# Patient Record
Sex: Female | Born: 1935 | Race: White | Hispanic: No | Smoking: Never smoker
Health system: Southern US, Community
[De-identification: ages and names within clinical notes are randomized; demographics above are authoritative.]

## PROBLEM LIST (undated history)

## (undated) DIAGNOSIS — I10 Essential (primary) hypertension: Secondary | ICD-10-CM

## (undated) DIAGNOSIS — E039 Hypothyroidism, unspecified: Secondary | ICD-10-CM

## (undated) DIAGNOSIS — E785 Hyperlipidemia, unspecified: Secondary | ICD-10-CM

## (undated) DIAGNOSIS — R002 Palpitations: Secondary | ICD-10-CM

## (undated) DIAGNOSIS — K219 Gastro-esophageal reflux disease without esophagitis: Secondary | ICD-10-CM

## (undated) DIAGNOSIS — N289 Disorder of kidney and ureter, unspecified: Secondary | ICD-10-CM

## (undated) HISTORY — DX: Disorder of kidney and ureter, unspecified: N28.9

## (undated) HISTORY — DX: Palpitations: R00.2

## (undated) HISTORY — DX: Essential (primary) hypertension: I10

## (undated) HISTORY — DX: Hypothyroidism, unspecified: E03.9

## (undated) HISTORY — DX: Hyperlipidemia, unspecified: E78.5

---
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
## 1999-04-23 HISTORY — PX: CHOLECYSTECTOMY: SHX55

## 2015-10-20 NOTE — Telephone Encounter (Signed)
Name of Caller: [name]    Contact Phone Number: [phone]    Physician requesting Consult: Dr. Tommy Medaletyuk    Patient Location (facility name, room, bed number): [facility] 4[facility] Bed 1     Patient Diagnosis/Reason for Consult: HOLD:Routine-new onset afib    Provider being paged: HOLD    Method used to page: HOLD    Time page was sent: HOLD    [facility]    Smart Web Page Content: Diana MastersHOLD

## 2016-05-31 DIAGNOSIS — E785 Hyperlipidemia, unspecified: Secondary | ICD-10-CM | POA: Diagnosis not present

## 2016-05-31 DIAGNOSIS — E039 Hypothyroidism, unspecified: Secondary | ICD-10-CM | POA: Diagnosis not present

## 2016-05-31 DIAGNOSIS — R5383 Other fatigue: Secondary | ICD-10-CM | POA: Diagnosis not present

## 2016-09-03 DIAGNOSIS — E785 Hyperlipidemia, unspecified: Secondary | ICD-10-CM | POA: Diagnosis not present

## 2016-09-03 DIAGNOSIS — I10 Essential (primary) hypertension: Secondary | ICD-10-CM | POA: Diagnosis not present

## 2016-09-03 DIAGNOSIS — E039 Hypothyroidism, unspecified: Secondary | ICD-10-CM | POA: Diagnosis not present

## 2016-09-03 DIAGNOSIS — N189 Chronic kidney disease, unspecified: Secondary | ICD-10-CM | POA: Diagnosis not present

## 2016-09-23 DIAGNOSIS — R002 Palpitations: Secondary | ICD-10-CM | POA: Diagnosis not present

## 2016-09-23 DIAGNOSIS — E785 Hyperlipidemia, unspecified: Secondary | ICD-10-CM | POA: Diagnosis not present

## 2016-09-23 DIAGNOSIS — E039 Hypothyroidism, unspecified: Secondary | ICD-10-CM | POA: Diagnosis not present

## 2016-09-23 DIAGNOSIS — I10 Essential (primary) hypertension: Secondary | ICD-10-CM | POA: Diagnosis not present

## 2016-09-26 DIAGNOSIS — D239 Other benign neoplasm of skin, unspecified: Secondary | ICD-10-CM | POA: Diagnosis not present

## 2016-09-26 DIAGNOSIS — L438 Other lichen planus: Secondary | ICD-10-CM | POA: Diagnosis not present

## 2016-09-26 DIAGNOSIS — L578 Other skin changes due to chronic exposure to nonionizing radiation: Secondary | ICD-10-CM | POA: Diagnosis not present

## 2016-09-26 DIAGNOSIS — L853 Xerosis cutis: Secondary | ICD-10-CM | POA: Diagnosis not present

## 2016-09-26 DIAGNOSIS — L814 Other melanin hyperpigmentation: Secondary | ICD-10-CM | POA: Diagnosis not present

## 2017-01-25 DIAGNOSIS — Z23 Encounter for immunization: Secondary | ICD-10-CM | POA: Diagnosis not present

## 2017-02-22 DIAGNOSIS — Z79899 Other long term (current) drug therapy: Secondary | ICD-10-CM | POA: Diagnosis not present

## 2017-02-22 DIAGNOSIS — N189 Chronic kidney disease, unspecified: Secondary | ICD-10-CM | POA: Diagnosis not present

## 2017-03-05 DIAGNOSIS — F4321 Adjustment disorder with depressed mood: Secondary | ICD-10-CM | POA: Diagnosis not present

## 2017-03-05 DIAGNOSIS — I10 Essential (primary) hypertension: Secondary | ICD-10-CM | POA: Diagnosis not present

## 2017-07-17 DIAGNOSIS — E039 Hypothyroidism, unspecified: Secondary | ICD-10-CM | POA: Diagnosis not present

## 2017-07-17 DIAGNOSIS — E785 Hyperlipidemia, unspecified: Secondary | ICD-10-CM | POA: Diagnosis not present

## 2017-07-17 DIAGNOSIS — J309 Allergic rhinitis, unspecified: Secondary | ICD-10-CM | POA: Diagnosis not present

## 2017-07-17 DIAGNOSIS — I10 Essential (primary) hypertension: Secondary | ICD-10-CM | POA: Diagnosis not present

## 2017-10-17 DIAGNOSIS — E039 Hypothyroidism, unspecified: Secondary | ICD-10-CM | POA: Insufficient documentation

## 2017-10-17 DIAGNOSIS — E785 Hyperlipidemia, unspecified: Secondary | ICD-10-CM | POA: Insufficient documentation

## 2017-10-17 DIAGNOSIS — R002 Palpitations: Secondary | ICD-10-CM | POA: Insufficient documentation

## 2017-10-17 DIAGNOSIS — E782 Mixed hyperlipidemia: Secondary | ICD-10-CM

## 2017-10-17 DIAGNOSIS — E038 Other specified hypothyroidism: Secondary | ICD-10-CM | POA: Insufficient documentation

## 2017-10-17 DIAGNOSIS — I129 Hypertensive chronic kidney disease with stage 1 through stage 4 chronic kidney disease, or unspecified chronic kidney disease: Secondary | ICD-10-CM | POA: Insufficient documentation

## 2017-10-17 DIAGNOSIS — I10 Essential (primary) hypertension: Secondary | ICD-10-CM

## 2017-10-17 HISTORY — DX: Mixed hyperlipidemia: E78.2

## 2017-10-17 HISTORY — DX: Other specified hypothyroidism: E03.8

## 2017-10-17 HISTORY — DX: Palpitations: R00.2

## 2017-10-17 HISTORY — DX: Essential (primary) hypertension: I10

## 2017-10-29 DIAGNOSIS — E038 Other specified hypothyroidism: Secondary | ICD-10-CM | POA: Diagnosis not present

## 2017-10-29 DIAGNOSIS — R000 Tachycardia, unspecified: Secondary | ICD-10-CM | POA: Diagnosis not present

## 2017-10-29 DIAGNOSIS — K219 Gastro-esophageal reflux disease without esophagitis: Secondary | ICD-10-CM | POA: Diagnosis not present

## 2017-10-29 DIAGNOSIS — I10 Essential (primary) hypertension: Secondary | ICD-10-CM | POA: Diagnosis not present

## 2017-10-29 DIAGNOSIS — E782 Mixed hyperlipidemia: Secondary | ICD-10-CM | POA: Diagnosis not present

## 2017-10-30 DIAGNOSIS — I10 Essential (primary) hypertension: Secondary | ICD-10-CM | POA: Diagnosis not present

## 2017-10-30 DIAGNOSIS — E782 Mixed hyperlipidemia: Secondary | ICD-10-CM | POA: Diagnosis not present

## 2017-10-30 DIAGNOSIS — E038 Other specified hypothyroidism: Secondary | ICD-10-CM | POA: Diagnosis not present

## 2017-11-05 DIAGNOSIS — N183 Chronic kidney disease, stage 3 (moderate): Secondary | ICD-10-CM | POA: Diagnosis not present

## 2017-11-17 ENCOUNTER — Ambulatory Visit (INDEPENDENT_AMBULATORY_CARE_PROVIDER_SITE_OTHER): Payer: Medicare Other | Admitting: Cardiology

## 2017-11-17 ENCOUNTER — Encounter: Payer: Self-pay | Admitting: Cardiology

## 2017-11-17 VITALS — BP 158/80 | HR 74 | Ht 65.0 in | Wt 146.0 lb

## 2017-11-17 DIAGNOSIS — I10 Essential (primary) hypertension: Secondary | ICD-10-CM

## 2017-11-17 DIAGNOSIS — R002 Palpitations: Secondary | ICD-10-CM

## 2017-11-17 DIAGNOSIS — E785 Hyperlipidemia, unspecified: Secondary | ICD-10-CM | POA: Diagnosis not present

## 2017-11-17 DIAGNOSIS — E039 Hypothyroidism, unspecified: Secondary | ICD-10-CM | POA: Diagnosis not present

## 2017-11-17 NOTE — Patient Instructions (Signed)
Medication Instructions:  Your physician recommends that you continue on your current medications as directed. Please refer to the Current Medication list given to you today.  Labwork: Your physician recommends that you have the following labs drawn: BMP in 3 months  Testing/Procedures: Your physician has recommended that you wear a holter monitor. Holter monitors are medical devices that record the heart's electrical activity. Doctors most often use these monitors to diagnose arrhythmias. Arrhythmias are problems with the speed or rhythm of the heartbeat. The monitor is a small, portable device. You can wear one while you do your normal daily activities. This is usually used to diagnose what is causing palpitations/syncope (passing out).  Follow-Up: Your physician recommends that you schedule a follow-up appointment in: 6 months  Any Other Special Instructions Will Be Listed Below (If Applicable).     If you need a refill on your cardiac medications before your next appointment, please call your pharmacy.   [place], RN, BSN

## 2017-11-17 NOTE — Progress Notes (Signed)
Cardiology Consultation:    Date:  11/17/2017   ID:  Taisa Deloria, DOB 02-19-1935, MRN [id number]  PCP:  Lillard Anes, MD  Cardiologist:  Jenne Campus, MD   Referring MD: Lillard Anes,*   Chief Complaint  Patient presents with  . Tachycardia    2 years  . Establish Care  I need to be established as a patient  History of Present Illness:    Arnetra Terris is a 83 y.o. female who is being seen today for the evaluation of palpitations at the request of Lillard Anes,*.  Threasa Beards recently relocated from [place] she does have family here in [place] that is why she decided to moving here years ago exactly 2015 she started having some palpitations echocardiogram was done which showed preserved left ventricular ejection fraction, Holter monitor was put on and she was identified to have some supraventricular ectopy with some atrial tachycardias she was given metoprolol 25 twice daily with good response since that time she seems to be doing well overall she appears to be in pretty good health she still walks and have no difficulty doing it no chest pain tightness squeezing pressure burning chest no much palpitations sometimes she will be aware of her heart spitting she cannot describe onset office of those episodes.  There are no associated symptoms with it.  Overall she seems to be in good shape  Past Medical History:  Diagnosis Date  . Essential hypertension 10/17/2017  . Hyperlipidemia with target LDL less than 100 10/17/2017  . Hypothyroidism 10/17/2017  . Kidney disease    Stage 3  . Palpitations 10/17/2017    Past Surgical History:  Procedure Laterality Date  . CHOLECYSTECTOMY  2001    Current Medications: Current Meds  Medication Sig  . acetaminophen (TYLENOL) 500 MG tablet Take 500 mg by mouth as needed.  Marland Kitchen aspirin EC 81 MG tablet Take 81 mg by mouth daily.  Marland Kitchen levothyroxine (SYNTHROID, LEVOTHROID) 75 MCG tablet Take 75 mcg by mouth daily  before breakfast.  . lisinopril (PRINIVIL,ZESTRIL) 20 MG tablet Take 20 mg by mouth daily.  Marland Kitchen loratadine (CLARITIN) 10 MG tablet Take 10 mg by mouth daily.  . metoprolol tartrate (LOPRESSOR) 25 MG tablet Take 25 mg by mouth 2 (two) times daily.  Marland Kitchen omeprazole (PRILOSEC) 20 MG capsule Take 20 mg by mouth 2 (two) times daily before a meal.  . polyethylene glycol (MIRALAX / GLYCOLAX) packet Take 17 g by mouth daily as needed.  . simvastatin (ZOCOR) 40 MG tablet Take 40 mg by mouth daily.     Allergies:   Patient has no known allergies.   Social History   Socioeconomic History  . Marital status: Widowed    Spouse name: Not on file  . Number of children: Not on file  . Years of education: Not on file  . Highest education level: Not on file  Occupational History  . Not on file  Social Needs  . Financial resource strain: Not on file  . Food insecurity:    Worry: Not on file    Inability: Not on file  . Transportation needs:    Medical: Not on file    Non-medical: Not on file  Tobacco Use  . Smoking status: Never Smoker  . Smokeless tobacco: Never Used  Substance and Sexual Activity  . Alcohol use: Not Currently  . Drug use: Not Currently  . Sexual activity: Not on file  Lifestyle  . Physical activity:  Days per week: Not on file    Minutes per session: Not on file  . Stress: Not on file  Relationships  . Social connections:    Talks on phone: Not on file    Gets together: Not on file    Attends religious service: Not on file    Active member of club or organization: Not on file    Attends meetings of clubs or organizations: Not on file    Relationship status: Not on file  Other Topics Concern  . Not on file  Social History Narrative  . Not on file     Family History: The patient's family history includes Heart attack in her mother; Leukemia in her father. ROS:   Please see the history of present illness.    All 14 point review of systems negative except as  described per history of present illness.  EKGs/Labs/Other Studies Reviewed:    The following studies were reviewed today: I did review echocardiogram done in 2015 in [place] showed preserved left ventricular ejection fraction.  There was also a Holter monitor with had a chance to review she got few episodes of atrial tachycardia  EKG:  EKG is  ordered today.  The ekg ordered today demonstrates normal sinus rhythm normal P interval normal QS complex duration morphology  Recent Labs: No results found for requested labs within last 8760 hours.  Recent Lipid Panel No results found for: CHOL, TRIG, HDL, CHOLHDL, VLDL, LDLCALC, LDLDIRECT  Physical Exam:    VS:  BP (!) 158/80 (BP Location: Left Arm, Patient Position: Sitting, Cuff Size: Normal)   Pulse 74   Ht 5\' 5"  (1.651 m)   Wt 146 lb (66.2 kg)   SpO2 99%   BMI 24.30 kg/m     Wt Readings from Last 3 Encounters:  11/17/17 146 lb (66.2 kg)     GEN:  Well nourished, well developed in no acute distress HEENT: Normal NECK: No JVD; No carotid bruits LYMPHATICS: No lymphadenopathy CARDIAC: RRR, no murmurs, no rubs, no gallops RESPIRATORY:  Clear to auscultation without rales, wheezing or rhonchi  ABDOMEN: Soft, non-tender, non-distended MUSCULOSKELETAL:  No edema; No deformity  SKIN: Warm and dry NEUROLOGIC:  Alert and oriented x 3 PSYCHIATRIC:  Normal affect   ASSESSMENT:    1. Palpitations   2. Essential hypertension   3. Hyperlipidemia with target LDL less than 100   4. Acquired hypothyroidism    PLAN:    In order of problems listed above:  1. Palpitations with history of atrial tachycardia.  She is successfully managed with metoprolol however I will ask her to wear a Holter monitor for 48 hours to see if she still gets some significant arrhythmia.  I do not see reason to repeat echocardiogram right now unless we find significant ectopy. 2. Essential hypertension blood pressure appears to be well controlled we will  continue present management 3. Dyslipidemia she takes simvastatin which I will continue. 4. Acquired hypothyroidism.  Followed by internal medicine team apparently stable 5. Kidney dysfunction we will recheck kidney function in 3 months.   Medication Adjustments/Labs and Tests Ordered: Current medicines are reviewed at length with the patient today.  Concerns regarding medicines are outlined above.  Orders Placed This Encounter  Procedures  . Basic metabolic panel  . HOLTER MONITOR - 48 HOUR  . EKG 12-Lead   No orders of the defined types were placed in this encounter.   Signed, Park Liter, MD, [facility]. 11/17/2017 5:08 PM  [facility]

## 2017-11-28 ENCOUNTER — Ambulatory Visit (INDEPENDENT_AMBULATORY_CARE_PROVIDER_SITE_OTHER): Payer: Medicare Other

## 2017-11-28 DIAGNOSIS — R002 Palpitations: Secondary | ICD-10-CM | POA: Diagnosis not present

## 2018-01-23 DIAGNOSIS — J018 Other acute sinusitis: Secondary | ICD-10-CM | POA: Diagnosis not present

## 2018-02-11 DIAGNOSIS — Z23 Encounter for immunization: Secondary | ICD-10-CM | POA: Diagnosis not present

## 2018-02-12 DIAGNOSIS — I129 Hypertensive chronic kidney disease with stage 1 through stage 4 chronic kidney disease, or unspecified chronic kidney disease: Secondary | ICD-10-CM | POA: Diagnosis not present

## 2018-02-12 DIAGNOSIS — D631 Anemia in chronic kidney disease: Secondary | ICD-10-CM | POA: Diagnosis not present

## 2018-02-12 DIAGNOSIS — N183 Chronic kidney disease, stage 3 (moderate): Secondary | ICD-10-CM | POA: Diagnosis not present

## 2018-02-14 ENCOUNTER — Other Ambulatory Visit: Payer: Self-pay | Admitting: Nephrology

## 2018-02-14 DIAGNOSIS — N183 Chronic kidney disease, stage 3 unspecified: Secondary | ICD-10-CM

## 2018-02-14 DIAGNOSIS — I129 Hypertensive chronic kidney disease with stage 1 through stage 4 chronic kidney disease, or unspecified chronic kidney disease: Secondary | ICD-10-CM

## 2018-02-20 DIAGNOSIS — Z961 Presence of intraocular lens: Secondary | ICD-10-CM | POA: Diagnosis not present

## 2018-03-02 ENCOUNTER — Ambulatory Visit
Admission: RE | Admit: 2018-03-02 | Discharge: 2018-03-02 | Disposition: A | Discharge status: Home / Self Care | Payer: Medicare Other | Source: Ambulatory Visit | Attending: Nephrology | Admitting: Nephrology

## 2018-03-02 DIAGNOSIS — I129 Hypertensive chronic kidney disease with stage 1 through stage 4 chronic kidney disease, or unspecified chronic kidney disease: Secondary | ICD-10-CM

## 2018-03-02 DIAGNOSIS — N183 Chronic kidney disease, stage 3 unspecified: Secondary | ICD-10-CM

## 2018-04-08 DIAGNOSIS — Z23 Encounter for immunization: Secondary | ICD-10-CM | POA: Diagnosis not present

## 2018-04-08 DIAGNOSIS — Z6826 Body mass index (BMI) 26.0-26.9, adult: Secondary | ICD-10-CM | POA: Diagnosis not present

## 2018-04-08 DIAGNOSIS — R1084 Generalized abdominal pain: Secondary | ICD-10-CM | POA: Diagnosis not present

## 2018-04-20 DIAGNOSIS — Z1211 Encounter for screening for malignant neoplasm of colon: Secondary | ICD-10-CM | POA: Diagnosis not present

## 2018-05-04 DIAGNOSIS — N183 Chronic kidney disease, stage 3 (moderate): Secondary | ICD-10-CM | POA: Diagnosis not present

## 2018-05-04 DIAGNOSIS — Z6826 Body mass index (BMI) 26.0-26.9, adult: Secondary | ICD-10-CM | POA: Diagnosis not present

## 2018-05-04 DIAGNOSIS — I10 Essential (primary) hypertension: Secondary | ICD-10-CM | POA: Diagnosis not present

## 2018-05-04 DIAGNOSIS — B070 Plantar wart: Secondary | ICD-10-CM | POA: Diagnosis not present

## 2018-05-04 DIAGNOSIS — K219 Gastro-esophageal reflux disease without esophagitis: Secondary | ICD-10-CM | POA: Diagnosis not present

## 2018-05-04 DIAGNOSIS — E038 Other specified hypothyroidism: Secondary | ICD-10-CM | POA: Diagnosis not present

## 2018-05-04 DIAGNOSIS — E782 Mixed hyperlipidemia: Secondary | ICD-10-CM | POA: Diagnosis not present

## 2018-05-12 DIAGNOSIS — Z1231 Encounter for screening mammogram for malignant neoplasm of breast: Secondary | ICD-10-CM | POA: Diagnosis not present

## 2018-05-18 DIAGNOSIS — B070 Plantar wart: Secondary | ICD-10-CM | POA: Diagnosis not present

## 2018-06-01 DIAGNOSIS — N183 Chronic kidney disease, stage 3 (moderate): Secondary | ICD-10-CM | POA: Diagnosis not present

## 2018-06-23 ENCOUNTER — Encounter: Payer: Self-pay | Admitting: Cardiology

## 2018-06-23 ENCOUNTER — Ambulatory Visit (INDEPENDENT_AMBULATORY_CARE_PROVIDER_SITE_OTHER): Payer: Medicare Other | Admitting: Cardiology

## 2018-06-23 VITALS — BP 140/72 | HR 81 | Ht 65.0 in | Wt 145.4 lb

## 2018-06-23 DIAGNOSIS — R002 Palpitations: Secondary | ICD-10-CM

## 2018-06-23 DIAGNOSIS — E039 Hypothyroidism, unspecified: Secondary | ICD-10-CM | POA: Diagnosis not present

## 2018-06-23 DIAGNOSIS — I10 Essential (primary) hypertension: Secondary | ICD-10-CM | POA: Diagnosis not present

## 2018-06-23 MED ORDER — METOPROLOL SUCCINATE ER 50 MG PO TB24: 75.0000 mg | ORAL_TABLET | Freq: Every day | ORAL | 3 refills | Status: DC

## 2018-06-23 NOTE — Progress Notes (Signed)
Cardiology Office Note:    Date:  06/23/2018   ID:  Darneshia Demary, DOB [birth year], MRN [id number]  PCP:  Lillard Anes, MD  Cardiologist:  Jenne Campus, MD    Referring MD: Lillard Anes,*   Chief Complaint  Patient presents with  . Follow-up  Doing well  History of Present Illness:    Atina Feeley is a 84 y.o. female with palpitations she was discovered to have what appears to be atrial tachycardia beta-blocker has been giving with success but still have some breakthrough arrhythmia rather rare does not bother her too much no dizziness no passing out overall very active she started walking again she is enjoying it.  Past Medical History:  Diagnosis Date  . Essential hypertension 10/17/2017  . Hyperlipidemia with target LDL less than 100 10/17/2017  . Hypothyroidism 10/17/2017  . Kidney disease    Stage 3  . Palpitations 10/17/2017    Past Surgical History:  Procedure Laterality Date  . CHOLECYSTECTOMY  2001    Current Medications: Current Meds  Medication Sig  . acetaminophen (TYLENOL) 500 MG tablet Take 500 mg by mouth as needed.  Marland Kitchen aspirin EC 81 MG tablet Take 81 mg by mouth daily.  . Cholecalciferol (VITAMIN D3) 50 MCG (2000 UT) TABS Take 1 tablet by mouth daily.  Marland Kitchen levothyroxine (SYNTHROID, LEVOTHROID) 75 MCG tablet Take 75 mcg by mouth daily before breakfast.  . lisinopril (PRINIVIL,ZESTRIL) 20 MG tablet Take 20 mg by mouth daily.  . metoprolol tartrate (LOPRESSOR) 25 MG tablet Take 25 mg by mouth 2 (two) times daily.  . Multiple Vitamin (MULTIVITAMIN) capsule Take 1 capsule by mouth daily.  Marland Kitchen omeprazole (PRILOSEC) 20 MG capsule Take 20 mg by mouth 2 (two) times daily before a meal.  . simvastatin (ZOCOR) 40 MG tablet Take 40 mg by mouth daily.     Allergies:   Patient has no known allergies.   Social History   Socioeconomic History  . Marital status: Widowed    Spouse name: Not on file  . Number of children: Not on file  . Years of  education: Not on file  . Highest education level: Not on file  Occupational History  . Not on file  Social Needs  . Financial resource strain: Not on file  . Food insecurity:    Worry: Not on file    Inability: Not on file  . Transportation needs:    Medical: Not on file    Non-medical: Not on file  Tobacco Use  . Smoking status: Never Smoker  . Smokeless tobacco: Never Used  Substance and Sexual Activity  . Alcohol use: Not Currently  . Drug use: Not Currently  . Sexual activity: Not on file  Lifestyle  . Physical activity:    Days per week: Not on file    Minutes per session: Not on file  . Stress: Not on file  Relationships  . Social connections:    Talks on phone: Not on file    Gets together: Not on file    Attends religious service: Not on file    Active member of club or organization: Not on file    Attends meetings of clubs or organizations: Not on file    Relationship status: Not on file  Other Topics Concern  . Not on file  Social History Narrative  . Not on file     Family History: The patient's family history includes Heart attack in her mother; Leukemia in her father. ROS:  Please see the history of present illness.    All 14 point review of systems negative except as described per history of present illness  EKGs/Labs/Other Studies Reviewed:      Recent Labs: No results found for requested labs within last 8760 hours.  Recent Lipid Panel No results found for: CHOL, TRIG, HDL, CHOLHDL, VLDL, LDLCALC, LDLDIRECT  Physical Exam:    VS:  BP 140/72   Pulse 81   Ht 5\' 5"  (1.651 m)   Wt 145 lb 6.4 oz (66 kg)   SpO2 98%   BMI 24.20 kg/m     Wt Readings from Last 3 Encounters:  06/23/18 145 lb 6.4 oz (66 kg)  11/17/17 146 lb (66.2 kg)     GEN:  Well nourished, well developed in no acute distress HEENT: Normal NECK: No JVD; No carotid bruits LYMPHATICS: No lymphadenopathy CARDIAC: RRR, no murmurs, no rubs, no gallops RESPIRATORY:  Clear to  auscultation without rales, wheezing or rhonchi  ABDOMEN: Soft, non-tender, non-distended MUSCULOSKELETAL:  No edema; No deformity  SKIN: Warm and dry LOWER EXTREMITIES: no swelling NEUROLOGIC:  Alert and oriented x 3 PSYCHIATRIC:  Normal affect   ASSESSMENT:    1. Palpitations   2. Essential hypertension   3. Acquired hypothyroidism    PLAN:    In order of problems listed above:  1. Palpitations it looks like narrow complex tachycardia on small dose of beta-blocker but I will try to increase to see if I can suppress her arrhythmia.  I warned her about potential side effects of this and asked her to go back to the previous dose if she cannot tolerate the higher dose.  I did review her echocardiogram which was normal. 2. Essential hypertension blood pressure appears to be well controlled continue present management. 3. Hypothyroidism followed by internal medicine team.   Medication Adjustments/Labs and Tests Ordered: Current medicines are reviewed at length with the patient today.  Concerns regarding medicines are outlined above.  No orders of the defined types were placed in this encounter.  Medication changes: No orders of the defined types were placed in this encounter.   Signed, Park Liter, MD, [facility] 06/23/2018 11:40 AM    [place]

## 2018-06-23 NOTE — Patient Instructions (Signed)
Medication Instructions:  START taking metoprolol succinate 75 mg (1 tablet) daily STOP taking metoprolol tartrate  If you need a refill on your cardiac medications before your next appointment, please call your pharmacy.   Lab work: NONE If you have labs (blood work) drawn today and your tests are completely normal, you will receive your results only by: Marland Kitchen MyChart Message (if you have MyChart) OR . A paper copy in the mail If you have any lab test that is abnormal or we need to change your treatment, we will call you to review the results.  Testing/Procedures: NONE  Follow-Up: At [facility], you and your health needs are our priority.  As part of our continuing mission to provide you with exceptional heart care, we have created designated Provider Care Teams.  These Care Teams include your primary Cardiologist (physician) and Advanced Practice Providers (APPs -  Physician Assistants and Nurse Practitioners) who all work together to provide you with the care you need, when you need it. You will need a follow up appointment in 6 months.

## 2018-06-30 DIAGNOSIS — E559 Vitamin D deficiency, unspecified: Secondary | ICD-10-CM | POA: Diagnosis not present

## 2018-06-30 DIAGNOSIS — N183 Chronic kidney disease, stage 3 (moderate): Secondary | ICD-10-CM | POA: Diagnosis not present

## 2018-06-30 DIAGNOSIS — I129 Hypertensive chronic kidney disease with stage 1 through stage 4 chronic kidney disease, or unspecified chronic kidney disease: Secondary | ICD-10-CM | POA: Diagnosis not present

## 2018-06-30 DIAGNOSIS — D509 Iron deficiency anemia, unspecified: Secondary | ICD-10-CM | POA: Diagnosis not present

## 2018-12-21 DIAGNOSIS — Z23 Encounter for immunization: Secondary | ICD-10-CM | POA: Diagnosis not present

## 2018-12-24 ENCOUNTER — Ambulatory Visit: Payer: Medicare Other | Admitting: Cardiology

## 2019-01-13 DIAGNOSIS — N183 Chronic kidney disease, stage 3 (moderate): Secondary | ICD-10-CM | POA: Diagnosis not present

## 2019-01-13 DIAGNOSIS — N189 Chronic kidney disease, unspecified: Secondary | ICD-10-CM | POA: Diagnosis not present

## 2019-01-19 DIAGNOSIS — D631 Anemia in chronic kidney disease: Secondary | ICD-10-CM | POA: Diagnosis not present

## 2019-01-19 DIAGNOSIS — N183 Chronic kidney disease, stage 3 (moderate): Secondary | ICD-10-CM | POA: Diagnosis not present

## 2019-01-19 DIAGNOSIS — E559 Vitamin D deficiency, unspecified: Secondary | ICD-10-CM | POA: Diagnosis not present

## 2019-01-19 DIAGNOSIS — I129 Hypertensive chronic kidney disease with stage 1 through stage 4 chronic kidney disease, or unspecified chronic kidney disease: Secondary | ICD-10-CM | POA: Diagnosis not present

## 2019-01-28 DIAGNOSIS — Z6826 Body mass index (BMI) 26.0-26.9, adult: Secondary | ICD-10-CM | POA: Diagnosis not present

## 2019-01-28 DIAGNOSIS — E782 Mixed hyperlipidemia: Secondary | ICD-10-CM | POA: Diagnosis not present

## 2019-01-28 DIAGNOSIS — Z1159 Encounter for screening for other viral diseases: Secondary | ICD-10-CM | POA: Diagnosis not present

## 2019-01-28 DIAGNOSIS — I10 Essential (primary) hypertension: Secondary | ICD-10-CM | POA: Diagnosis not present

## 2019-01-28 DIAGNOSIS — N1831 Chronic kidney disease, stage 3a: Secondary | ICD-10-CM | POA: Diagnosis not present

## 2019-01-28 DIAGNOSIS — K219 Gastro-esophageal reflux disease without esophagitis: Secondary | ICD-10-CM | POA: Diagnosis not present

## 2019-01-28 DIAGNOSIS — E559 Vitamin D deficiency, unspecified: Secondary | ICD-10-CM | POA: Diagnosis not present

## 2019-01-28 DIAGNOSIS — E038 Other specified hypothyroidism: Secondary | ICD-10-CM | POA: Diagnosis not present

## 2019-02-10 ENCOUNTER — Ambulatory Visit: Payer: Medicare Other | Admitting: Cardiology

## 2019-04-07 ENCOUNTER — Ambulatory Visit: Payer: Medicare Other | Admitting: Cardiology

## 2019-05-12 ENCOUNTER — Ambulatory Visit: Admit: 2019-05-12 | Payer: MEDICARE

## 2019-05-31 DIAGNOSIS — D509 Iron deficiency anemia, unspecified: Secondary | ICD-10-CM | POA: Insufficient documentation

## 2019-05-31 DIAGNOSIS — N1831 Chronic kidney disease, stage 3a: Secondary | ICD-10-CM

## 2019-05-31 DIAGNOSIS — E559 Vitamin D deficiency, unspecified: Secondary | ICD-10-CM | POA: Insufficient documentation

## 2019-05-31 DIAGNOSIS — K219 Gastro-esophageal reflux disease without esophagitis: Secondary | ICD-10-CM | POA: Insufficient documentation

## 2019-05-31 HISTORY — DX: Iron deficiency anemia, unspecified: D50.9

## 2019-05-31 HISTORY — DX: Chronic kidney disease, stage 3a: N18.31

## 2019-05-31 HISTORY — DX: Vitamin D deficiency, unspecified: E55.9

## 2019-06-01 ENCOUNTER — Ambulatory Visit: Payer: Medicare Other | Admitting: Legal Medicine

## 2019-06-09 ENCOUNTER — Ambulatory Visit: Admit: 2019-06-09 | Payer: MEDICARE

## 2019-06-11 ENCOUNTER — Ambulatory Visit: Payer: Medicare Other | Admitting: Cardiology

## 2019-08-06 ENCOUNTER — Other Ambulatory Visit: Payer: Self-pay | Admitting: Legal Medicine

## 2019-08-09 ENCOUNTER — Other Ambulatory Visit: Payer: Self-pay

## 2019-08-09 MED ORDER — LEVOTHYROXINE SODIUM 75 MCG PO TABS: 75.0000 ug | ORAL_TABLET | Freq: Every day | ORAL | 1 refills | Status: DC

## 2019-08-20 DIAGNOSIS — N189 Chronic kidney disease, unspecified: Secondary | ICD-10-CM | POA: Diagnosis not present

## 2019-08-20 DIAGNOSIS — N183 Chronic kidney disease, stage 3 unspecified: Secondary | ICD-10-CM | POA: Diagnosis not present

## 2019-08-30 ENCOUNTER — Other Ambulatory Visit: Payer: Self-pay

## 2019-08-30 MED ORDER — LISINOPRIL 20 MG PO TABS: 20.0000 mg | ORAL_TABLET | Freq: Every day | ORAL | 0 refills | Status: DC

## 2019-09-01 DIAGNOSIS — I129 Hypertensive chronic kidney disease with stage 1 through stage 4 chronic kidney disease, or unspecified chronic kidney disease: Secondary | ICD-10-CM | POA: Diagnosis not present

## 2019-09-01 DIAGNOSIS — N183 Chronic kidney disease, stage 3 unspecified: Secondary | ICD-10-CM | POA: Diagnosis not present

## 2019-09-01 DIAGNOSIS — E559 Vitamin D deficiency, unspecified: Secondary | ICD-10-CM | POA: Diagnosis not present

## 2019-09-01 DIAGNOSIS — D631 Anemia in chronic kidney disease: Secondary | ICD-10-CM | POA: Diagnosis not present

## 2019-09-14 NOTE — Telephone Encounter (Signed)
This encounter was created in error - please disregard.

## 2019-09-15 ENCOUNTER — Encounter: Payer: Self-pay | Admitting: Legal Medicine

## 2019-09-15 DIAGNOSIS — B070 Plantar wart: Secondary | ICD-10-CM

## 2019-09-15 HISTORY — DX: Plantar wart: B07.0

## 2019-09-16 ENCOUNTER — Encounter: Payer: Self-pay | Admitting: Legal Medicine

## 2019-09-16 ENCOUNTER — Other Ambulatory Visit: Payer: Self-pay

## 2019-09-16 ENCOUNTER — Ambulatory Visit (INDEPENDENT_AMBULATORY_CARE_PROVIDER_SITE_OTHER): Payer: Medicare Other | Admitting: Legal Medicine

## 2019-09-16 VITALS — BP 130/70 | HR 69 | Temp 97.3°F | Resp 17 | Ht 61.7 in | Wt 145.0 lb

## 2019-09-16 DIAGNOSIS — Z6826 Body mass index (BMI) 26.0-26.9, adult: Secondary | ICD-10-CM

## 2019-09-16 DIAGNOSIS — D509 Iron deficiency anemia, unspecified: Secondary | ICD-10-CM

## 2019-09-16 DIAGNOSIS — N1831 Chronic kidney disease, stage 3a: Secondary | ICD-10-CM

## 2019-09-16 DIAGNOSIS — K2100 Gastro-esophageal reflux disease with esophagitis, without bleeding: Secondary | ICD-10-CM

## 2019-09-16 DIAGNOSIS — M8589 Other specified disorders of bone density and structure, multiple sites: Secondary | ICD-10-CM

## 2019-09-16 DIAGNOSIS — E038 Other specified hypothyroidism: Secondary | ICD-10-CM | POA: Diagnosis not present

## 2019-09-16 DIAGNOSIS — I10 Essential (primary) hypertension: Secondary | ICD-10-CM

## 2019-09-16 DIAGNOSIS — E782 Mixed hyperlipidemia: Secondary | ICD-10-CM

## 2019-09-16 DIAGNOSIS — E559 Vitamin D deficiency, unspecified: Secondary | ICD-10-CM

## 2019-09-16 DIAGNOSIS — N3000 Acute cystitis without hematuria: Secondary | ICD-10-CM | POA: Diagnosis not present

## 2019-09-16 DIAGNOSIS — E039 Hypothyroidism, unspecified: Secondary | ICD-10-CM

## 2019-09-16 HISTORY — DX: Body mass index (BMI) 26.0-26.9, adult: Z68.26

## 2019-09-16 LAB — POCT URINALYSIS DIP (CLINITEK)
Bilirubin, UA: NEGATIVE
Blood, UA: NEGATIVE
Glucose, UA: NEGATIVE mg/dL
Ketones, POC UA: NEGATIVE mg/dL
Nitrite, UA: NEGATIVE
POC PROTEIN,UA: NEGATIVE
Spec Grav, UA: 1.02 (ref 1.010–1.025)
Urobilinogen, UA: 0.2 E.U./dL
pH, UA: 5.5 (ref 5.0–8.0)

## 2019-09-16 MED ORDER — LISINOPRIL 20 MG PO TABS: 20.0000 mg | ORAL_TABLET | Freq: Every day | ORAL | 2 refills | Status: DC

## 2019-09-16 MED ORDER — NITROFURANTOIN MONOHYD MACRO 100 MG PO CAPS: 100.0000 mg | ORAL_CAPSULE | Freq: Every day | ORAL | 0 refills | Status: DC

## 2019-09-16 MED ORDER — LEVOTHYROXINE SODIUM 75 MCG PO TABS: 75.0000 ug | ORAL_TABLET | Freq: Every day | ORAL | 2 refills | Status: DC

## 2019-09-16 MED ORDER — METOPROLOL TARTRATE 25 MG PO TABS: 25.0000 mg | ORAL_TABLET | Freq: Two times a day (BID) | ORAL | 2 refills | Status: DC

## 2019-09-16 MED ORDER — VITAMIN D3 50 MCG (2000 UT) PO TABS: 1.0000 | ORAL_TABLET | Freq: Every day | ORAL | 2 refills | Status: DC

## 2019-09-16 MED ORDER — OMEPRAZOLE 20 MG PO CPDR: 20.0000 mg | DELAYED_RELEASE_CAPSULE | Freq: Every day | ORAL | 2 refills | Status: DC

## 2019-09-16 MED ORDER — SIMVASTATIN 40 MG PO TABS: 40.0000 mg | ORAL_TABLET | Freq: Every day | ORAL | 2 refills | Status: DC

## 2019-09-16 MED ORDER — IRON 325 (65 FE) MG PO TABS: 2.0000 | ORAL_TABLET | Freq: Every day | ORAL | 2 refills | Status: DC

## 2019-09-16 NOTE — Assessment & Plan Note (Signed)

## 2019-09-16 NOTE — Assessment & Plan Note (Signed)
Patient is known to have hypothyroid and is on treatment with levothyroxine 40mcg.  Patient was diagnosed 10 years ago.  Other treatment includes none.  Patient is compliant with medicines and last TSH 6 months ago.  Last TSH was 1.13.

## 2019-09-16 NOTE — Progress Notes (Signed)
 Established Patient Office Visit  Subjective:  Patient ID: Karen Briggs, female    DOB: 01/25/1935  Age: 85 y.o. MRN: [id number]  CC:  Chief Complaint  Patient presents with  . Urinary Tract Infection    Patient does not feel well, so she wants to be check  . Hypotension    Patient has some days low blood pressure    HPI Karen Briggs presents for Chronic visit.  Patielnt presents for follow up of hypertension.  Patient tolerating metoprolol and lisinopri well with side effects.  Patient was diagnosed with hypertension 2010 so has been treated for hypertension for 10 years.Patient is working on maintaining diet and exercise regimen and follows up as directed. Complication include none.  Patient presents with hyperlipidemia.  Compliance with treatment has been good; patient takes medicines as directed, maintains low cholesterol diet, follows up as directed, and maintains exercise regimen.  Patient is using simvastatin without problems.  Patient has HYPOTHYROIDISM.  Diagnosed 10 years ago.  Patient has stable thyroid readings.  Patient is having loss of energy.  Last TSH was 1.13.  continue dosage of thyroid medicine.   Past Medical History:  Diagnosis Date  . Essential hypertension 10/17/2017  . Gastroesophageal reflux disease   . Hypothyroidism 10/17/2017  . Kidney disease    Stage 3  . Palpitations 10/17/2017  . Plantar wart 09/15/2019    Past Surgical History:  Procedure Laterality Date  . CHOLECYSTECTOMY  2001    Family History  Problem Relation Age of Onset  . Heart attack Mother   . Leukemia Father     Social History   Socioeconomic History  . Marital status: Widowed    Spouse name: Not on file  . Number of children: 2  . Years of education: Not on file  . Highest education level: Not on file  Occupational History  . Occupation: Retired  Tobacco Use  . Smoking status: Never Smoker  . Smokeless tobacco: Never Used  Substance and Sexual Activity  .  Alcohol use: Not Currently  . Drug use: Never  . Sexual activity: Not Currently  Other Topics Concern  . Not on file  Social History Narrative  . Not on file   Social Determinants of Health   Financial Resource Strain:   . Difficulty of Paying Living Expenses:   Food Insecurity:   . Worried About Running Out of Food in the Last Year:   . Ran Out of Food in the Last Year:   Transportation Needs:   . Lack of Transportation (Medical):   . Lack of Transportation (Non-Medical):   Physical Activity:   . Days of Exercise per Week:   . Minutes of Exercise per Session:   Stress:   . Feeling of Stress :   Social Connections:   . Frequency of Communication with Friends and Family:   . Frequency of Social Gatherings with Friends and Family:   . Attends Religious Services:   . Active Member of Clubs or Organizations:   . Attends Club or Organization Meetings:   . Marital Status:   Intimate Partner Violence:   . Fear of Current or Ex-Partner:   . Emotionally Abused:   . Physically Abused:   . Sexually Abused:     Outpatient Medications Prior to Visit  Medication Sig Dispense Refill  . acetaminophen (TYLENOL) 500 MG tablet Take 500 mg by mouth as needed.    . aspirin EC 81 MG tablet Take 81 mg by mouth daily.    .   Multiple Vitamin (MULTIVITAMIN) capsule Take 1 capsule by mouth daily.    . Cholecalciferol (VITAMIN D3) 50 MCG (2000 UT) TABS Take 1 tablet by mouth daily.    . Ferrous Sulfate (IRON) 325 (65 Fe) MG TABS Take 2 tablets by mouth daily.    Marland Kitchen levothyroxine (SYNTHROID) 75 MCG tablet Take 1 tablet (75 mcg total) by mouth daily before breakfast. 90 tablet 1  . lisinopril (ZESTRIL) 20 MG tablet Take 1 tablet (20 mg total) by mouth daily. 30 tablet 0  . metoprolol tartrate (LOPRESSOR) 25 MG tablet Take 1 tablet by mouth 2 (two) times daily.    Marland Kitchen omeprazole (PRILOSEC) 20 MG capsule TAKE ONE CAPSULE BY MOUTH TWICE DAILY (Patient taking differently: Take by mouth daily. ) 180 capsule  2  . simvastatin (ZOCOR) 40 MG tablet Take 40 mg by mouth daily.    . metoprolol succinate (TOPROL XL) 50 MG 24 hr tablet Take 2 tablets (100 mg total) by mouth daily. Take 1-1/2 tablets by mouth daily. Take with or immediately following a meal (Patient taking differently: Take 50 mg by mouth daily. ) 45 tablet 3   No facility-administered medications prior to visit.    No Known Allergies  ROS Review of Systems  Constitutional: Negative.   HENT: Negative.   Eyes: Negative.   Respiratory: Negative.   Cardiovascular: Negative.   Gastrointestinal: Negative.   Endocrine: Negative.   Genitourinary: Negative.   Skin: Negative.   Neurological: Negative.   Psychiatric/Behavioral: Negative.       Objective:    Physical Exam  Constitutional: She is oriented to person, place, and time. She appears well-developed and well-nourished.  HENT:  Head: Normocephalic and atraumatic.  Right Ear: External ear normal.  Left Ear: External ear normal.  Nose: Nose normal.  Mouth/Throat: Oropharynx is clear and moist.  Eyes: Pupils are equal, round, and reactive to light. Conjunctivae and EOM are normal.  Cardiovascular: Normal rate, regular rhythm, normal heart sounds and intact distal pulses.  Pulmonary/Chest: Effort normal and breath sounds normal.  Abdominal: Soft. Bowel sounds are normal.  Musculoskeletal:        General: Normal range of motion.     Cervical back: Normal range of motion and neck supple.  Neurological: She is alert and oriented to person, place, and time. She has normal reflexes.  Skin: Skin is warm and dry.  Psychiatric: She has a normal mood and affect. Her behavior is normal. Judgment and thought content normal.  Vitals reviewed.   BP 130/70 (BP Location: Right Arm, Patient Position: Sitting)   Pulse 69   Temp (!) 97.3 F (36.3 C) (Temporal)   Resp 17   Ht 5' 1.7" (1.567 m)   Wt 145 lb (65.8 kg)   SpO2 99%   BMI 26.78 kg/m  Wt Readings from Last 3 Encounters:    09/16/19 145 lb (65.8 kg)  06/23/18 145 lb 6.4 oz (66 kg)  11/17/17 146 lb (66.2 kg)     Health Maintenance Due  Topic Date Due  . COVID-19 Vaccine (1) Never done  . TETANUS/TDAP  Never done  . DEXA SCAN  Never done  . PNA vac Low Risk Adult (1 of 2 - PCV13) Never done    There are no preventive care reminders to display for this patient.  No results found for: TSH No results found for: WBC, HGB, HCT, MCV, PLT No results found for: NA, K, CHLORIDE, CO2, GLUCOSE, BUN, CREATININE, BILITOT, ALKPHOS, AST, ALT, PROT, ALBUMIN, CALCIUM, ANIONGAP,  EGFR, GFR No results found for: CHOL No results found for: HDL No results found for: LDLCALC No results found for: TRIG No results found for: CHOLHDL No results found for: HGBA1C    Assessment & Plan:   Problem List Items Addressed This Visit      Cardiovascular and Mediastinum   Essential hypertension    An individual hypertension care plan was established and reinforced today.  The patient's status was assessed using clinical findings on exam and labs or diagnostic tests. The patient's success at meeting treatment goals on disease specific evidence-based guidelines and found to be well controlled. SELF MANAGEMENT: The patient and I together assessed ways to personally work towards obtaining the recommended goals. RECOMMENDATIONS: avoid decongestants found in common cold remedies, decrease consumption of alcohol, perform routine monitoring of BP with home BP cuff, exercise, reduction of dietary salt, take medicines as prescribed, try not to miss doses and quit smoking.  Regular exercise and maintaining a healthy weight is needed.  Stress reduction may help. A CLINICAL SUMMARY including written plan identify barriers to care unique to individual due to social or financial issues.  We attempt to mutually creat solutions for individual and family understanding.      Relevant Medications   simvastatin (ZOCOR) 40 MG tablet   metoprolol  tartrate (LOPRESSOR) 25 MG tablet   lisinopril (ZESTRIL) 20 MG tablet   Other Relevant Orders   CBC with Differential/Platelet   Comprehensive metabolic panel     Digestive   Esophageal reflux (Chronic)    Plan of care was formulated today.  She is doing well.  A plan of care was formulated using patient exam, tests and other sources to optimize care using evidence based information.  Recommend no smoking, no eating after supper, avoid fatty foods, elevate Head of bed, avoid tight fitting clothing.  Continue on omeprazole.      Relevant Medications   omeprazole (PRILOSEC) 20 MG capsule     Endocrine   Other specified hypothyroidism    Patient is known to have hypothyroid and is on treatment with levothyroxine 75mcg.  Patient was diagnosed 10 years ago.  Other treatment includes none.  Patient is compliant with medicines and last TSH 6 months ago.  Last TSH was 1.13.      Relevant Medications   metoprolol tartrate (LOPRESSOR) 25 MG tablet   levothyroxine (SYNTHROID) 75 MCG tablet   Other Relevant Orders   TSH     Genitourinary   Chronic kidney disease, stage 3a (Chronic)    AN INDIVIDUAL CARE PLAN was established and reinforced today.  The patient's status was assessed using clinical findings on exam, labs, and other diagnostic testing. Patient's success at meeting treatment goals based on disease specific evidence-bassed guidelines and found to be in good control. RECOMMENDATIONS include maintaining present medicines and treatment.        Other   Iron deficiency anemia (Chronic)    AN INDIVIDUAL CARE PLAN for iron deficient anemia was established and reinforced today.  The patient's status was assessed using clinical findings on exam, labs, and other diagnostic testing. Patient's success at meeting treatment goals based on disease specific evidence-bassed guidelines and found to be in good control. RECOMMENDATIONS include maintaining present medicines and treatment.       Relevant Medications   Ferrous Sulfate (IRON) 325 (65 Fe) MG TABS   Mixed hyperlipidemia    AN INDIVIDUAL CARE PLAN for hyperlipidemia/ cholesterol was established and reinforced today.  The patient's status was assessed   using clinical findings on exam, lab and other diagnostic tests. The patient's disease status was assessed based on evidence-based guidelines and found to be well controlled. MEDICATIONS were reviewed. SELF MANAGEMENT GOALS have been discussed and patient's success at attaining the goal of low cholesterol was assessed. RECOMMENDATION given include regular exercise 3 days a week and low cholesterol/low fat diet. CLINICAL SUMMARY including written plan to identify barriers unique to the patient due to social or economic  reasons was discussed.      Relevant Medications   simvastatin (ZOCOR) 40 MG tablet   metoprolol tartrate (LOPRESSOR) 25 MG tablet   lisinopril (ZESTRIL) 20 MG tablet   Other Relevant Orders   Lipid panel   BMI 26.0-26.9,adult    Continue diet and exercise.  At 84 yo, he does not need to lose weight       Other Visit Diagnoses    Acute cystitis without hematuria    -  Primary   Relevant Medications   nitrofurantoin, macrocrystal-monohydrate, (MACROBID) 100 MG capsule   Other Relevant Orders   POCT URINALYSIS DIP (CLINITEK) (Completed)   Urine Culture   Osteopenia of multiple sites       Relevant Orders   DG Bone Density   Primary hypothyroidism       Relevant Medications   metoprolol tartrate (LOPRESSOR) 25 MG tablet   levothyroxine (SYNTHROID) 75 MCG tablet   Vitamin D deficiency       Relevant Medications   Cholecalciferol (VITAMIN D3) 50 MCG (2000 UT) TABS      Meds ordered this encounter  Medications  . nitrofurantoin, macrocrystal-monohydrate, (MACROBID) 100 MG capsule    Sig: Take 1 capsule (100 mg total) by mouth at bedtime.    Dispense:  7 capsule    Refill:  0  . simvastatin (ZOCOR) 40 MG tablet    Sig: Take 1 tablet (40 mg  total) by mouth daily.    Dispense:  90 tablet    Refill:  2  . omeprazole (PRILOSEC) 20 MG capsule    Sig: Take 1 capsule (20 mg total) by mouth daily.    Dispense:  90 capsule    Refill:  2  . metoprolol tartrate (LOPRESSOR) 25 MG tablet    Sig: Take 1 tablet (25 mg total) by mouth 2 (two) times daily.    Dispense:  180 tablet    Refill:  2  . lisinopril (ZESTRIL) 20 MG tablet    Sig: Take 1 tablet (20 mg total) by mouth daily.    Dispense:  90 tablet    Refill:  2    Patient needs an appointment with Dr Perry.  . levothyroxine (SYNTHROID) 75 MCG tablet    Sig: Take 1 tablet (75 mcg total) by mouth daily before breakfast.    Dispense:  90 tablet    Refill:  2  . Ferrous Sulfate (IRON) 325 (65 Fe) MG TABS    Sig: Take 2 tablets (650 mg total) by mouth daily.    Dispense:  90 tablet    Refill:  2  . Cholecalciferol (VITAMIN D3) 50 MCG (2000 UT) TABS    Sig: Take 1 tablet by mouth daily.    Dispense:  90 tablet    Refill:  2    Follow-up: Return in about 6 months (around 03/18/2020) for fasting.    Lawrence Perry, MD 

## 2019-09-16 NOTE — Assessment & Plan Note (Signed)

## 2019-09-16 NOTE — Assessment & Plan Note (Signed)
Continue diet and exercise.  At 85 yo, he does not need to lose weight

## 2019-09-16 NOTE — Assessment & Plan Note (Signed)
AN INDIVIDUAL CARE PLAN was established and reinforced today.  The patient's status was assessed using clinical findings on exam, labs, and other diagnostic testing. Patient's success at meeting treatment goals based on disease specific evidence-bassed guidelines and found to be in good control. RECOMMENDATIONS include maintaining present medicines and treatment. 

## 2019-09-16 NOTE — Assessment & Plan Note (Signed)
AN INDIVIDUAL CARE PLAN for iron deficient anemia was established and reinforced today.  The patient's status was assessed using clinical findings on exam, labs, and other diagnostic testing. Patient's success at meeting treatment goals based on disease specific evidence-bassed guidelines and found to be in good control. RECOMMENDATIONS include maintaining present medicines and treatment.

## 2019-09-16 NOTE — Assessment & Plan Note (Signed)
Plan of care was formulated today.  She is doing well.  A plan of care was formulated using patient exam, tests and other sources to optimize care using evidence based information.  Recommend no smoking, no eating after supper, avoid fatty foods, elevate Head of bed, avoid tight fitting clothing.  Continue on omeprazole. 

## 2019-09-17 LAB — CBC WITH DIFFERENTIAL/PLATELET
Basophils Absolute: 0.1 10*3/uL (ref 0.0–0.2)
Basos: 1 %
EOS (ABSOLUTE): 0.2 10*3/uL (ref 0.0–0.4)
Eos: 3 %
Hematocrit: 35.5 % (ref 34.0–46.6)
Hemoglobin: 11.6 g/dL (ref 11.1–15.9)
Immature Grans (Abs): 0 10*3/uL (ref 0.0–0.1)
Immature Granulocytes: 0 %
Lymphocytes Absolute: 2.3 10*3/uL (ref 0.7–3.1)
Lymphs: 30 %
MCH: 30.9 pg (ref 26.6–33.0)
MCHC: 32.7 g/dL (ref 31.5–35.7)
MCV: 94 fL (ref 79–97)
Monocytes Absolute: 0.6 10*3/uL (ref 0.1–0.9)
Monocytes: 8 %
Neutrophils Absolute: 4.5 10*3/uL (ref 1.4–7.0)
Neutrophils: 58 %
Platelets: 311 10*3/uL (ref 150–450)
RBC: 3.76 x10E6/uL — ABNORMAL LOW (ref 3.77–5.28)
RDW: 13 % (ref 11.7–15.4)
WBC: 7.7 10*3/uL (ref 3.4–10.8)

## 2019-09-17 LAB — COMPREHENSIVE METABOLIC PANEL
ALT: 14 IU/L (ref 0–32)
AST: 23 IU/L (ref 0–40)
Albumin/Globulin Ratio: 1.9 (ref 1.2–2.2)
Albumin: 4.8 g/dL — ABNORMAL HIGH (ref 3.6–4.6)
Alkaline Phosphatase: 67 IU/L (ref 48–121)
BUN/Creatinine Ratio: 13 (ref 12–28)
BUN: 16 mg/dL (ref 8–27)
Bilirubin Total: 0.3 mg/dL (ref 0.0–1.2)
CO2: 21 mmol/L (ref 20–29)
Calcium: 10.1 mg/dL (ref 8.7–10.3)
Chloride: 100 mmol/L (ref 96–106)
Creatinine, Ser: 1.21 mg/dL — ABNORMAL HIGH (ref 0.57–1.00)
GFR calc Af Amer: 48 mL/min/{1.73_m2} — ABNORMAL LOW (ref >59)
GFR calc non Af Amer: 42 mL/min/{1.73_m2} — ABNORMAL LOW (ref >59)
Globulin, Total: 2.5 g/dL (ref 1.5–4.5)
Glucose: 103 mg/dL — ABNORMAL HIGH (ref 65–99)
Potassium: 5.4 mmol/L — ABNORMAL HIGH (ref 3.5–5.2)
Sodium: 137 mmol/L (ref 134–144)
Total Protein: 7.3 g/dL (ref 6.0–8.5)

## 2019-09-17 LAB — LIPID PANEL
Chol/HDL Ratio: 2.1 ratio (ref 0.0–4.4)
Cholesterol, Total: 164 mg/dL (ref 100–199)
HDL: 77 mg/dL (ref >39)
LDL Chol Calc (NIH): 66 mg/dL (ref 0–99)
Triglycerides: 122 mg/dL (ref 0–149)
VLDL Cholesterol Cal: 21 mg/dL (ref 5–40)

## 2019-09-17 LAB — URINE CULTURE

## 2019-09-17 LAB — TSH: TSH: 1.18 u[IU]/mL (ref 0.450–4.500)

## 2019-09-17 LAB — CARDIOVASCULAR RISK ASSESSMENT

## 2019-09-17 NOTE — Progress Notes (Signed)
CBC normal, glucose 103, kidney tests stable, potassium high 5.4, recheck one week, liver tests normal, Cholesterol normal, TSH 1.18 OK lp

## 2019-09-19 NOTE — Progress Notes (Signed)
Urine culture negative ?lp

## 2019-09-22 ENCOUNTER — Other Ambulatory Visit: Payer: Self-pay

## 2019-09-22 DIAGNOSIS — I10 Essential (primary) hypertension: Secondary | ICD-10-CM

## 2019-09-22 MED ORDER — LISINOPRIL 20 MG PO TABS: 20.0000 mg | ORAL_TABLET | Freq: Every day | ORAL | 2 refills | Status: DC

## 2019-09-23 ENCOUNTER — Other Ambulatory Visit: Payer: Medicare Other

## 2019-09-23 ENCOUNTER — Other Ambulatory Visit: Payer: Self-pay

## 2019-09-23 ENCOUNTER — Other Ambulatory Visit: Payer: Self-pay | Admitting: Legal Medicine

## 2019-09-23 DIAGNOSIS — E875 Hyperkalemia: Secondary | ICD-10-CM

## 2019-09-23 LAB — COMPREHENSIVE METABOLIC PANEL
ALT: 16 IU/L (ref 0–32)
AST: 27 IU/L (ref 0–40)
Albumin/Globulin Ratio: 1.8 (ref 1.2–2.2)
Albumin: 4.6 g/dL (ref 3.6–4.6)
Alkaline Phosphatase: 64 IU/L (ref 48–121)
BUN/Creatinine Ratio: 15 (ref 12–28)
BUN: 19 mg/dL (ref 8–27)
Bilirubin Total: 0.3 mg/dL (ref 0.0–1.2)
CO2: 20 mmol/L (ref 20–29)
Calcium: 9.5 mg/dL (ref 8.7–10.3)
Chloride: 100 mmol/L (ref 96–106)
Creatinine, Ser: 1.25 mg/dL — ABNORMAL HIGH (ref 0.57–1.00)
GFR calc Af Amer: 46 mL/min/{1.73_m2} — ABNORMAL LOW (ref >59)
GFR calc non Af Amer: 40 mL/min/{1.73_m2} — ABNORMAL LOW (ref >59)
Globulin, Total: 2.5 g/dL (ref 1.5–4.5)
Glucose: 90 mg/dL (ref 65–99)
Potassium: 5.2 mmol/L (ref 3.5–5.2)
Sodium: 135 mmol/L (ref 134–144)
Total Protein: 7.1 g/dL (ref 6.0–8.5)

## 2019-09-23 NOTE — Progress Notes (Signed)
Patient is here to recheck CMP.

## 2019-09-24 NOTE — Progress Notes (Signed)
Kidney tests stable, potassium 5.2 now normal lp

## 2019-09-30 ENCOUNTER — Ambulatory Visit: Payer: Medicare Other

## 2019-10-12 ENCOUNTER — Telehealth: Payer: Self-pay

## 2019-10-12 ENCOUNTER — Other Ambulatory Visit: Payer: Self-pay | Admitting: Legal Medicine

## 2019-10-12 DIAGNOSIS — H906 Mixed conductive and sensorineural hearing loss, bilateral: Secondary | ICD-10-CM

## 2019-10-12 NOTE — Telephone Encounter (Signed)
Referred to Dr. Gaylyn Cheers for hearing lp

## 2019-10-12 NOTE — Telephone Encounter (Signed)
Patient would like referral to ENT for her hearing loss.

## 2019-10-26 ENCOUNTER — Other Ambulatory Visit: Payer: Self-pay

## 2019-10-26 ENCOUNTER — Ambulatory Visit (INDEPENDENT_AMBULATORY_CARE_PROVIDER_SITE_OTHER): Payer: Medicare Other | Admitting: Cardiology

## 2019-10-26 ENCOUNTER — Encounter: Payer: Self-pay | Admitting: Cardiology

## 2019-10-26 VITALS — BP 152/62 | HR 69 | Ht 61.0 in | Wt 138.2 lb

## 2019-10-26 DIAGNOSIS — N1831 Chronic kidney disease, stage 3a: Secondary | ICD-10-CM

## 2019-10-26 DIAGNOSIS — R002 Palpitations: Secondary | ICD-10-CM | POA: Diagnosis not present

## 2019-10-26 DIAGNOSIS — I10 Essential (primary) hypertension: Secondary | ICD-10-CM | POA: Diagnosis not present

## 2019-10-26 DIAGNOSIS — E782 Mixed hyperlipidemia: Secondary | ICD-10-CM

## 2019-10-26 MED ORDER — LISINOPRIL 10 MG PO TABS: 10.0000 mg | ORAL_TABLET | Freq: Every day | ORAL | 1 refills | Status: DC

## 2019-10-26 NOTE — Patient Instructions (Addendum)
Medication Instructions:  Your physician has recommended you make the following change in your medication:  DECREASE: Lisinopril from 20 to 10 mg daily   *If you need a refill on your cardiac medications before your next appointment, please call your pharmacy*   Lab Work: None.  If you have labs (blood work) drawn today and your tests are completely normal, you will receive your results only by: Marland Kitchen MyChart Message (if you have MyChart) OR . A paper copy in the mail If you have any lab test that is abnormal or we need to change your treatment, we will call you to review the results.   Testing/Procedures: None.    Follow-Up: At [facility], you and your health needs are our priority.  As part of our continuing mission to provide you with exceptional heart care, we have created designated Provider Care Teams.  These Care Teams include your primary Cardiologist (physician) and Advanced Practice Providers (APPs -  Physician Assistants and Nurse Practitioners) who all work together to provide you with the care you need, when you need it.  We recommend signing up for the patient portal called "MyChart".  Sign up information is provided on this After Visit Summary.  MyChart is used to connect with patients for Virtual Visits (Telemedicine).  Patients are able to view lab/test results, encounter notes, upcoming appointments, etc.  Non-urgent messages can be sent to your provider as well.   To learn more about what you can do with MyChart, go to [email or web address].    Your next appointment:   6 month(s)  The format for your next appointment:   In Person  Provider:   Jenne Campus, MD   Other Instructions

## 2019-10-26 NOTE — Progress Notes (Signed)
Cardiology Office Note:    Date:  10/26/2019   ID:  Karen Briggs, DOB [birth year], MRN [id number]  PCP:  Lillard Anes, MD  Cardiologist:  Jenne Campus, MD    Referring MD: Lillard Anes,*   No chief complaint on file. I am doing fine  History of Present Illness:    Karen Briggs is a 85 y.o. female with past medical history significant for palpitations.  That being successfully managed with small dose of beta-blocker she also have history of hypertension as well as chronic kidney failure and hypothyroidism.  She comes today to my office for follow-up.  Overall cardiac wise doing well denies have any chest pain tightness squeezing pressure burning chest no palpitations.  Overall doing well the issue is she brought her blood pressure measurements and sometimes her blood pressure is less than 100 on multiple occasions especially in the morning.  Past Medical History:  Diagnosis Date   Essential hypertension 10/17/2017   Gastroesophageal reflux disease    Hypothyroidism 10/17/2017   Kidney disease    Stage 3   Palpitations 10/17/2017   Plantar wart 09/15/2019    Past Surgical History:  Procedure Laterality Date   CHOLECYSTECTOMY  2001    Current Medications: Current Meds  Medication Sig   acetaminophen (TYLENOL) 500 MG tablet Take 500 mg by mouth as needed.   aspirin EC 81 MG tablet Take 81 mg by mouth daily.   Cholecalciferol (VITAMIN D3) 50 MCG (2000 UT) TABS Take 1 tablet by mouth daily.   Ferrous Sulfate (IRON) 325 (65 Fe) MG TABS Take 2 tablets (650 mg total) by mouth daily.   levothyroxine (SYNTHROID) 75 MCG tablet Take 1 tablet (75 mcg total) by mouth daily before breakfast.   lisinopril (ZESTRIL) 10 MG tablet Take 1 tablet (10 mg total) by mouth daily.   metoprolol tartrate (LOPRESSOR) 25 MG tablet Take 1 tablet (25 mg total) by mouth 2 (two) times daily.   Multiple Vitamin (MULTIVITAMIN) capsule Take 1 capsule by mouth daily.    omeprazole (PRILOSEC) 20 MG capsule Take 1 capsule (20 mg total) by mouth daily.   simvastatin (ZOCOR) 40 MG tablet Take 1 tablet (40 mg total) by mouth daily.   [DISCONTINUED] lisinopril (ZESTRIL) 20 MG tablet Take 1 tablet (20 mg total) by mouth daily.     Allergies:   Patient has no known allergies.   Social History   Socioeconomic History   Marital status: Widowed    Spouse name: Not on file   Number of children: 2   Years of education: Not on file   Highest education level: Not on file  Occupational History   Occupation: Retired  Tobacco Use   Smoking status: Never Smoker   Smokeless tobacco: Never Used  Substance and Sexual Activity   Alcohol use: Not Currently   Drug use: Never   Sexual activity: Not Currently  Other Topics Concern   Not on file  Social History Narrative   Not on file   Social Determinants of Health   Financial Resource Strain:    Difficulty of Paying Living Expenses:   Food Insecurity:    Worried About Charity fundraiser in the Last Year:    Arboriculturist in the Last Year:   Transportation Needs:    Film/video editor (Medical):    Lack of Transportation (Non-Medical):   Physical Activity:    Days of Exercise per Week:    Minutes of Exercise per Session:  Stress:    Feeling of Stress :   Social Connections:    Frequency of Communication with Friends and Family:    Frequency of Social Gatherings with Friends and Family:    Attends Religious Services:    Active Member of Clubs or Organizations:    Attends Archivist Meetings:    Marital Status:      Family History: The patient's family history includes Heart attack in her mother; Leukemia in her father. ROS:   Please see the history of present illness.    All 14 point review of systems negative except as described per history of present illness  EKGs/Labs/Other Studies Reviewed:      Recent Labs: 09/16/2019: Hemoglobin 11.6; Platelets  311; TSH 1.180 09/23/2019: ALT 16; BUN 19; Creatinine, Ser 1.25; Potassium 5.2; Sodium 135  Recent Lipid Panel    Component Value Date/Time   CHOL 164 09/16/2019 0954   TRIG 122 09/16/2019 0954   HDL 77 09/16/2019 0954   CHOLHDL 2.1 09/16/2019 0954   LDLCALC 66 09/16/2019 0954    Physical Exam:    VS:  BP (!) 152/62 (BP Location: Left Arm, Patient Position: Sitting, Cuff Size: Normal)    Pulse 69    Ht 5\' 1"  (1.549 m)    Wt 138 lb 3.2 oz (62.7 kg)    SpO2 98%    BMI 26.11 kg/m     Wt Readings from Last 3 Encounters:  10/26/19 138 lb 3.2 oz (62.7 kg)  09/16/19 145 lb (65.8 kg)  06/23/18 145 lb 6.4 oz (66 kg)     GEN:  Well nourished, well developed in no acute distress HEENT: Normal NECK: No JVD; No carotid bruits LYMPHATICS: No lymphadenopathy CARDIAC: RRR, no murmurs, no rubs, no gallops RESPIRATORY:  Clear to auscultation without rales, wheezing or rhonchi  ABDOMEN: Soft, non-tender, non-distended MUSCULOSKELETAL:  No edema; No deformity  SKIN: Warm and dry LOWER EXTREMITIES: no swelling NEUROLOGIC:  Alert and oriented x 3 PSYCHIATRIC:  Normal affect   ASSESSMENT:    1. Essential hypertension   2. Chronic kidney disease, stage 3a   3. Palpitations   4. Mixed hyperlipidemia    PLAN:    In order of problems listed above:  1. Essential hypertension I think she is overtreated.  I will cut down her lisinopril.  I do understand that it also carries some renal protective effect however more about her being hypotensive I with potential falls and sustaining injury.  Therefore I will cut down her lisinopril from 20 to 10 mg daily. 2. Chronic kidney failure: Followed by nephrology. 3. Palpitations: Successfully suppressed with small dose of beta-blocker which I will continue.  Overall doing well. 4. Dyslipidemia: I did review her K PN I do have data from Sep 16, 2019 which showed HDL of 77, surprisingly I do not see LDL.  We requested from primary care physician.  In the  meantime I will continue present medication which include simvastatin 40 which is moderate intensity statin   Medication Adjustments/Labs and Tests Ordered: Current medicines are reviewed at length with the patient today.  Concerns regarding medicines are outlined above.  Orders Placed This Encounter  Procedures   EKG 12-Lead   Medication changes:  Meds ordered this encounter  Medications   lisinopril (ZESTRIL) 10 MG tablet    Sig: Take 1 tablet (10 mg total) by mouth daily.    Dispense:  90 tablet    Refill:  1    Patient needs an  appointment with Dr Henrene Pastor.    Signed, Park Liter, MD, [name] 10/26/2019 12:02 PM    [place]

## 2019-11-22 DIAGNOSIS — H61303 Acquired stenosis of external ear canal, unspecified, bilateral: Secondary | ICD-10-CM | POA: Diagnosis not present

## 2019-11-22 DIAGNOSIS — H903 Sensorineural hearing loss, bilateral: Secondary | ICD-10-CM | POA: Diagnosis not present

## 2019-11-22 DIAGNOSIS — H6123 Impacted cerumen, bilateral: Secondary | ICD-10-CM | POA: Diagnosis not present

## 2019-12-06 DIAGNOSIS — Z780 Asymptomatic menopausal state: Secondary | ICD-10-CM | POA: Diagnosis not present

## 2019-12-06 DIAGNOSIS — N959 Unspecified menopausal and perimenopausal disorder: Secondary | ICD-10-CM | POA: Diagnosis not present

## 2019-12-06 DIAGNOSIS — M810 Age-related osteoporosis without current pathological fracture: Secondary | ICD-10-CM | POA: Diagnosis not present

## 2019-12-07 ENCOUNTER — Telehealth: Payer: Self-pay

## 2019-12-07 ENCOUNTER — Other Ambulatory Visit: Payer: Self-pay | Admitting: Legal Medicine

## 2019-12-07 DIAGNOSIS — M810 Age-related osteoporosis without current pathological fracture: Secondary | ICD-10-CM

## 2019-12-07 HISTORY — DX: Age-related osteoporosis without current pathological fracture: M81.0

## 2019-12-07 MED ORDER — ALENDRONATE SODIUM 70 MG PO TABS: 70.0000 mg | ORAL_TABLET | ORAL | 11 refills | Status: DC

## 2019-12-07 NOTE — Telephone Encounter (Signed)
I gave the results to [facility] about DEXA.

## 2019-12-22 ENCOUNTER — Other Ambulatory Visit: Payer: Self-pay | Admitting: Legal Medicine

## 2019-12-22 DIAGNOSIS — D509 Iron deficiency anemia, unspecified: Secondary | ICD-10-CM

## 2020-02-01 DIAGNOSIS — Z23 Encounter for immunization: Secondary | ICD-10-CM | POA: Diagnosis not present

## 2020-02-20 DIAGNOSIS — Z23 Encounter for immunization: Secondary | ICD-10-CM | POA: Diagnosis not present

## 2020-03-20 ENCOUNTER — Other Ambulatory Visit: Payer: Self-pay

## 2020-03-20 ENCOUNTER — Ambulatory Visit (INDEPENDENT_AMBULATORY_CARE_PROVIDER_SITE_OTHER): Payer: Medicare Other | Admitting: Legal Medicine

## 2020-03-20 ENCOUNTER — Encounter: Payer: Self-pay | Admitting: Legal Medicine

## 2020-03-20 VITALS — BP 134/68 | HR 85 | Temp 97.0°F | Resp 16 | Ht 61.0 in | Wt 132.6 lb

## 2020-03-20 DIAGNOSIS — E038 Other specified hypothyroidism: Secondary | ICD-10-CM | POA: Diagnosis not present

## 2020-03-20 DIAGNOSIS — N1831 Chronic kidney disease, stage 3a: Secondary | ICD-10-CM

## 2020-03-20 DIAGNOSIS — D509 Iron deficiency anemia, unspecified: Secondary | ICD-10-CM

## 2020-03-20 DIAGNOSIS — K2100 Gastro-esophageal reflux disease with esophagitis, without bleeding: Secondary | ICD-10-CM

## 2020-03-20 DIAGNOSIS — M810 Age-related osteoporosis without current pathological fracture: Secondary | ICD-10-CM | POA: Diagnosis not present

## 2020-03-20 DIAGNOSIS — E559 Vitamin D deficiency, unspecified: Secondary | ICD-10-CM | POA: Diagnosis not present

## 2020-03-20 DIAGNOSIS — E782 Mixed hyperlipidemia: Secondary | ICD-10-CM

## 2020-03-20 DIAGNOSIS — I10 Essential (primary) hypertension: Secondary | ICD-10-CM | POA: Diagnosis not present

## 2020-03-20 LAB — POCT URINALYSIS DIP (CLINITEK)
Blood, UA: NEGATIVE
Glucose, UA: NEGATIVE mg/dL
Leukocytes, UA: NEGATIVE
Nitrite, UA: NEGATIVE
POC PROTEIN,UA: NEGATIVE
Spec Grav, UA: 1.02 (ref 1.010–1.025)
Urobilinogen, UA: 0.2 E.U./dL
pH, UA: 5 (ref 5.0–8.0)

## 2020-03-20 NOTE — Progress Notes (Signed)
Subjective:  Patient ID: Karen Briggs, female    DOB: Feb 02, 1935  Age: 85 y.o. MRN: [id number]  Chief Complaint  Patient presents with  . Hypertension  . Hyperlipidemia    HPI: Chronic visit  Patient presents for follow up of hypertension.  Patient tolerating lisinopril, metoprolol well with side effects.  Patient was diagnosed with hypertension 2010 so has been treated for hypertension for 10 years.Patient is working on maintaining diet and exercise regimen and follows up as directed. Complication include none.  Patient presents with hyperlipidemia.  Compliance with treatment has been good; patient takes medicines as directed, maintains low cholesterol diet, follows up as directed, and maintains exercise regimen.  Patient is using simvastatin without problems.  Patient has HYPOTHYROIDISM.  Diagnosed 10 years ago.  Patient has stable thyroid readings.  Patient is having no symptoms.  Last TSH was normal.  continue dosage of thyroid medicine.   Current Outpatient Medications on File Prior to Visit  Medication Sig Dispense Refill  . acetaminophen (TYLENOL) 500 MG tablet Take 500 mg by mouth as needed.    Marland Kitchen alendronate (FOSAMAX) 70 MG tablet Take 1 tablet (70 mg total) by mouth every 7 (seven) days. Take with a full glass of water on an empty stomach. 4 tablet 11  . aspirin EC 81 MG tablet Take 81 mg by mouth daily.    . Cholecalciferol (VITAMIN D3) 50 MCG (2000 UT) TABS Take 1 tablet by mouth daily. 90 tablet 2  . FEROSUL 325 (65 Fe) MG tablet TAKE 2 TABLETS EVERY DAY 180 tablet 3  . levothyroxine (SYNTHROID) 75 MCG tablet Take 1 tablet (75 mcg total) by mouth daily before breakfast. 90 tablet 2  . lisinopril (ZESTRIL) 10 MG tablet Take 1 tablet (10 mg total) by mouth daily. 90 tablet 1  . metoprolol tartrate (LOPRESSOR) 25 MG tablet Take 1 tablet (25 mg total) by mouth 2 (two) times daily. 180 tablet 2  . Multiple Vitamin (MULTIVITAMIN) capsule Take 1 capsule by mouth  daily.    Marland Kitchen omeprazole (PRILOSEC) 20 MG capsule Take 1 capsule (20 mg total) by mouth daily. 90 capsule 2  . simvastatin (ZOCOR) 40 MG tablet Take 1 tablet (40 mg total) by mouth daily. 90 tablet 2   No current facility-administered medications on file prior to visit.   Past Medical History:  Diagnosis Date  . Essential hypertension 10/17/2017  . Kidney disease    Stage 3  . Palpitations 10/17/2017  . Plantar wart 09/15/2019   Past Surgical History:  Procedure Laterality Date  . CHOLECYSTECTOMY  2001    Family History  Problem Relation Age of Onset  . Heart attack Mother   . Leukemia Father    Social History   Socioeconomic History  . Marital status: Widowed    Spouse name: Not on file  . Number of children: 2  . Years of education: Not on file  . Highest education level: Not on file  Occupational History  . Occupation: Retired  Tobacco Use  . Smoking status: Never Smoker  . Smokeless tobacco: Never Used  Substance and Sexual Activity  . Alcohol use: Not Currently  . Drug use: Never  . Sexual activity: Not Currently  Other Topics Concern  . Not on file  Social History Narrative  . Not on file   Social Determinants of Health   Financial Resource Strain:   . Difficulty of Paying Living Expenses: Not on file  Food Insecurity:   . Worried About [employer] of  Food in the Last Year: Not on file  . Ran Out of Food in the Last Year: Not on file  Transportation Needs:   . Lack of Transportation (Medical): Not on file  . Lack of Transportation (Non-Medical): Not on file  Physical Activity:   . Days of Exercise per Week: Not on file  . Minutes of Exercise per Session: Not on file  Stress:   . Feeling of Stress : Not on file  Social Connections:   . Frequency of Communication with Friends and Family: Not on file  . Frequency of Social Gatherings with Friends and Family: Not on file  . Attends Religious Services: Not on file  . Active Member of Clubs or Organizations:  Not on file  . Attends Archivist Meetings: Not on file  . Marital Status: Not on file    Review of Systems  Constitutional: Negative for activity change, appetite change and fatigue.  HENT: Positive for congestion and hearing loss. Negative for sore throat.   Eyes: Negative for visual disturbance.  Respiratory: Negative for apnea, cough and shortness of breath.   Cardiovascular: Negative for chest pain and leg swelling.  Gastrointestinal: Negative for abdominal distention.  Genitourinary: Negative for difficulty urinating and dyspareunia.  Musculoskeletal: Positive for arthralgias.  Skin: Negative.   Neurological: Negative for dizziness, numbness and headaches.  Psychiatric/Behavioral: Negative.      Objective:  BP 134/68 (BP Location: Left Arm, Patient Position: Sitting, Cuff Size: Normal)   Pulse 85   Temp (!) 97 F (36.1 C) (Temporal)   Resp 16   Ht 5\' 1"  (1.549 m)   Wt 132 lb 9.6 oz (60.1 kg)   SpO2 97%   BMI 25.05 kg/m   BP/Weight 03/20/2020 10/26/2019 1/44/8185  Systolic BP 631 497 026  Diastolic BP 68 62 70  Wt. (Lbs) 132.6 138.2 145  BMI 25.05 26.11 26.78    Physical Exam Vitals reviewed.  Constitutional:      Appearance: Normal appearance.  HENT:     Nose: Nose normal.     Mouth/Throat:     Mouth: Mucous membranes are moist.     Pharynx: Oropharynx is clear.  Eyes:     Conjunctiva/sclera: Conjunctivae normal.     Pupils: Pupils are equal, round, and reactive to light.  Cardiovascular:     Rate and Rhythm: Regular rhythm.     Pulses: Normal pulses.     Heart sounds: Normal heart sounds.  Pulmonary:     Breath sounds: Stridor present.  Abdominal:     General: Abdomen is flat.  Musculoskeletal:        General: Normal range of motion.     Cervical back: Normal range of motion and neck supple.  Skin:    General: Skin is warm.     Capillary Refill: Capillary refill takes less than 2 seconds.  Neurological:     General: No focal deficit  present.     Mental Status: She is alert and oriented to person, place, and time.      Lab Results  Component Value Date   WBC 7.7 09/16/2019   HGB 11.6 09/16/2019   HCT 35.5 09/16/2019   PLT 311 09/16/2019   GLUCOSE 90 09/23/2019   CHOL 164 09/16/2019   TRIG 122 09/16/2019   HDL 77 09/16/2019   LDLCALC 66 09/16/2019   ALT 16 09/23/2019   AST 27 09/23/2019   NA 135 09/23/2019   K 5.2 09/23/2019   CL 100 09/23/2019  CREATININE 1.25 (H) 09/23/2019   BUN 19 09/23/2019   CO2 20 09/23/2019   TSH 1.180 09/16/2019      Assessment & Plan:   1. Essential hypertension - CBC with Differential/Platelet - Comprehensive metabolic panel An individual hypertension care plan was established and reinforced today.  The patient's status was assessed using clinical findings on exam and labs or diagnostic tests. The patient's success at meeting treatment goals on disease specific evidence-based guidelines and found to be well controlled. SELF MANAGEMENT: The patient and I together assessed ways to personally work towards obtaining the recommended goals. RECOMMENDATIONS: avoid decongestants found in common cold remedies, decrease consumption of alcohol, perform routine monitoring of BP with home BP cuff, exercise, reduction of dietary salt, take medicines as prescribed, try not to miss doses and quit smoking.  Regular exercise and maintaining a healthy weight is needed.  Stress reduction may help. A CLINICAL SUMMARY including written plan identify barriers to care unique to individual due to social or financial issues.  We attempt to mutually creat solutions for individual and family understanding.  2. Other specified hypothyroidism - TSH Patient is known to have hypothyroidism and is 0n treatment with levothyroxine. 67mcg Patient was diagnosed 10 years ago.  Other treatment includes none.  Patient is compliant with medicines and last TSH 6 months ago.  Last TSH was normal.  3. Mixed  hyperlipidemia - Lipid panel AN INDIVIDUAL CARE PLAN for hyperlipidemia/ cholesterol was established and reinforced today.  The patient's status was assessed using clinical findings on exam, lab and other diagnostic tests. The patient's disease status was assessed based on evidence-based guidelines and found to be fair controlled. MEDICATIONS were reviewed. SELF MANAGEMENT GOALS have been discussed and patient's success at attaining the goal of low cholesterol was assessed. RECOMMENDATION given include regular exercise 3 days a week and low cholesterol/low fat diet. CLINICAL SUMMARY including written plan to identify barriers unique to the patient due to social or economic  reasons was discussed.  4. Age-related osteoporosis without current pathological fracture Patient has chronic osteoporosis and GYN will set up DEXA this year  5. Chronic kidney disease, stage 3a ([facility]) - POCT URINALYSIS DIP (CLINITEK) AN INDIVIDUAL CARE PLAN renal disease was established and reinforced today.  The patient's status was assessed using clinical findings on exam, labs, and other diagnostic testing. Patient's success at meeting treatment goals based on disease specific evidence-bassed guidelines and found to be in good control. RECOMMENDATIONS include maintaining present medicines and treatment.  6. Gastroesophageal reflux disease with esophagitis without hemorrhage Plan of care was formulated today.  She is doing well.  A plan of care was formulated using patient exam, tests and other sources to optimize care using evidence based information.  Recommend no smoking, no eating after supper, avoid fatty foods, elevate Head of bed, avoid tight fitting clothing.  Continue on OTC.  7. Iron deficiency anemia, unspecified iron deficiency anemia type Patient has iron deficiency anemia and on iron supplements  8. Vitamin D insufficiency Patient has vitamin D deficiency and on supplements      Orders Placed This  Encounter  Procedures  . CBC with Differential/Platelet  . Comprehensive metabolic panel  . Lipid panel  . TSH  . POCT URINALYSIS DIP (CLINITEK)      I spent 30 minutes dedicated to the care of this patient on the date of this encounter to include face-to-face time with the patient, as well as: reviewing GYN records and old records in [place].  She  is UTD with , she needs Tdap TRX written  Follow-up: Return in about 6 months (around 09/17/2020) for fasting.  An After Visit Summary was printed and given to the patient.  Reinaldo Meeker, MD [facility] [phone]

## 2020-03-20 NOTE — Progress Notes (Deleted)
Subjective:  Patient ID: Karen Briggs, female    DOB: Aug 04, 1934  Age: 85 y.o. MRN: [id number]  Chief Complaint  Patient presents with  . Hypertension  . Hyperlipidemia    HPI : Chronic visit   Patient presents for follow up of hypertension.  Patient tolerating lisinopri well with side effects.  Patient was diagnosed with hypertension 2010 so has been treated for hypertension for 10 years.Patient is working on maintaining diet and exercise regimen and follows up as directed. Complication include none.  Patient presents with hyperlipidemia.  Compliance with treatment has been good; patient takes medicines as directed, maintains low cholesterol diet, follows up as directed, and maintains exercise regimen.  Patient is using simvastatin without problems. Current Outpatient Medications on File Prior to Visit  Medication Sig Dispense Refill  . acetaminophen (TYLENOL) 500 MG tablet Take 500 mg by mouth as needed.    Marland Kitchen alendronate (FOSAMAX) 70 MG tablet Take 1 tablet (70 mg total) by mouth every 7 (seven) days. Take with a full glass of water on an empty stomach. 4 tablet 11  . aspirin EC 81 MG tablet Take 81 mg by mouth daily.    . Cholecalciferol (VITAMIN D3) 50 MCG (2000 UT) TABS Take 1 tablet by mouth daily. 90 tablet 2  . FEROSUL 325 (65 Fe) MG tablet TAKE 2 TABLETS EVERY DAY 180 tablet 3  . levothyroxine (SYNTHROID) 75 MCG tablet Take 1 tablet (75 mcg total) by mouth daily before breakfast. 90 tablet 2  . lisinopril (ZESTRIL) 10 MG tablet Take 1 tablet (10 mg total) by mouth daily. 90 tablet 1  . metoprolol tartrate (LOPRESSOR) 25 MG tablet Take 1 tablet (25 mg total) by mouth 2 (two) times daily. 180 tablet 2  . Multiple Vitamin (MULTIVITAMIN) capsule Take 1 capsule by mouth daily.    Marland Kitchen omeprazole (PRILOSEC) 20 MG capsule Take 1 capsule (20 mg total) by mouth daily. 90 capsule 2  . simvastatin (ZOCOR) 40 MG tablet Take 1 tablet (40 mg total) by mouth daily. 90 tablet 2   No current  facility-administered medications on file prior to visit.   Past Medical History:  Diagnosis Date  . Essential hypertension 10/17/2017  . Kidney disease    Stage 3  . Palpitations 10/17/2017  . Plantar wart 09/15/2019   Past Surgical History:  Procedure Laterality Date  . CHOLECYSTECTOMY  2001    Family History  Problem Relation Age of Onset  . Heart attack Mother   . Leukemia Father    Social History   Socioeconomic History  . Marital status: Widowed    Spouse name: Not on file  . Number of children: 2  . Years of education: Not on file  . Highest education level: Not on file  Occupational History  . Occupation: Retired  Tobacco Use  . Smoking status: Never Smoker  . Smokeless tobacco: Never Used  Substance and Sexual Activity  . Alcohol use: Not Currently  . Drug use: Never  . Sexual activity: Not Currently  Other Topics Concern  . Not on file  Social History Narrative  . Not on file   Social Determinants of Health   Financial Resource Strain:   . Difficulty of Paying Living Expenses: Not on file  Food Insecurity:   . Worried About [employer] in the Last Year: Not on file  . Ran Out of Food in the Last Year: Not on file  Transportation Needs:   . Lack of Transportation (Medical): Not  on file  . Lack of Transportation (Non-Medical): Not on file  Physical Activity:   . Days of Exercise per Week: Not on file  . Minutes of Exercise per Session: Not on file  Stress:   . Feeling of Stress : Not on file  Social Connections:   . Frequency of Communication with Friends and Family: Not on file  . Frequency of Social Gatherings with Friends and Family: Not on file  . Attends Religious Services: Not on file  . Active Member of Clubs or Organizations: Not on file  . Attends Archivist Meetings: Not on file  . Marital Status: Not on file    Review of Systems  Constitutional: Positive for activity change. Negative for appetite change and fatigue.    HENT: Negative.   Eyes: Negative.   Respiratory: Negative.  Negative for cough and shortness of breath.   Cardiovascular: Negative for chest pain, palpitations and leg swelling.  Gastrointestinal: Negative for abdominal distention, abdominal pain and anal bleeding.  Genitourinary: Negative for difficulty urinating and dyspareunia.  Musculoskeletal: Positive for arthralgias.  Neurological: Negative for dizziness, syncope and speech difficulty.  Psychiatric/Behavioral: Negative.      Objective:  BP 134/68 (BP Location: Left Arm, Patient Position: Sitting, Cuff Size: Normal)   Pulse 85   Temp (!) 97 F (36.1 C) (Temporal)   Resp 16   Ht 5\' 1"  (1.549 m)   Wt 132 lb 9.6 oz (60.1 kg)   SpO2 97%   BMI 25.05 kg/m   BP/Weight 03/20/2020 10/26/2019 1/74/0814  Systolic BP 481 856 314  Diastolic BP 68 62 70  Wt. (Lbs) 132.6 138.2 145  BMI 25.05 26.11 26.78    Physical Exam Vitals reviewed.  Constitutional:      Appearance: Normal appearance.  HENT:     Right Ear: Tympanic membrane, ear canal and external ear normal.     Left Ear: Tympanic membrane, ear canal and external ear normal.     Mouth/Throat:     Mouth: Mucous membranes are moist.     Pharynx: Oropharynx is clear.  Eyes:     Extraocular Movements: Extraocular movements intact.     Conjunctiva/sclera: Conjunctivae normal.     Pupils: Pupils are equal, round, and reactive to light.  Cardiovascular:     Rate and Rhythm: Normal rate and regular rhythm.     Pulses: Normal pulses.     Heart sounds: Normal heart sounds.  Pulmonary:     Effort: Pulmonary effort is normal.     Breath sounds: Normal breath sounds.  Abdominal:     General: Abdomen is flat. Bowel sounds are normal.  Musculoskeletal:        General: Normal range of motion.     Cervical back: Normal range of motion.  Skin:    General: Skin is warm and dry.     Capillary Refill: Capillary refill takes less than 2 seconds.  Neurological:     General: No focal  deficit present.     Mental Status: She is alert and oriented to person, place, and time. Mental status is at baseline.  Psychiatric:        Mood and Affect: Mood normal.        Thought Content: Thought content normal.        Judgment: Judgment normal.       Lab Results  Component Value Date   WBC 7.7 09/16/2019   HGB 11.6 09/16/2019   HCT 35.5 09/16/2019   PLT 311  09/16/2019   GLUCOSE 90 09/23/2019   CHOL 164 09/16/2019   TRIG 122 09/16/2019   HDL 77 09/16/2019   LDLCALC 66 09/16/2019   ALT 16 09/23/2019   AST 27 09/23/2019   NA 135 09/23/2019   K 5.2 09/23/2019   CL 100 09/23/2019   CREATININE 1.25 (H) 09/23/2019   BUN 19 09/23/2019   CO2 20 09/23/2019   TSH 1.180 09/16/2019      Assessment & Plan:   Diagnoses and all orders for this visit: Essential hypertension -     CBC with Differential/Platelet -     Comprehensive metabolic panel An individual hypertension care plan was established and reinforced today.  The patient's status was assessed using clinical findings on exam and labs or diagnostic tests. The patient's success at meeting treatment goals on disease specific evidence-based guidelines and found to be well controlled. SELF MANAGEMENT: The patient and I together assessed ways to personally work towards obtaining the recommended goals. RECOMMENDATIONS: avoid decongestants found in common cold remedies, decrease consumption of alcohol, perform routine monitoring of BP with home BP cuff, exercise, reduction of dietary salt, take medicines as prescribed, try not to miss doses and quit smoking.  Regular exercise and maintaining a healthy weight is needed.  Stress reduction may help. A CLINICAL SUMMARY including written plan identify barriers to care unique to individual due to social or financial issues.  We attempt to mutually creat solutions for individual and family understanding. Other specified hypothyroidism -     TSH Patient is known to have hypothyroidism  and is n treatment with levothyroxine 75 mcg.  Patient was diagnosed 10 years ago.  Other treatment includes none.  Patient is compliant with medicines and last TSH 6 months ago.  Last TSH was normal . Mixed hyperlipidemia -     Lipid panel AN INDIVIDUAL CARE PLAN for hyperlipidemia/ cholesterol was established and reinforced today.  The patient's status was assessed using clinical findings on exam, lab and other diagnostic tests. The patient's disease status was assessed based on evidence-based guidelines and found to be well controlled. MEDICATIONS were reviewed. SELF MANAGEMENT GOALS have been discussed and patient's success at attaining the goal of low cholesterol was assessed. RECOMMENDATION given include regular exercise 3 days a week and low cholesterol/low fat diet. CLINICAL SUMMARY including written plan to identify barriers unique to the patient due to social or economic  reasons was discussed.  Age-related osteoporosis without current pathological fracture Patient has chronic osteoporosis and has been doing well on her Fosamax she is waiting for her GYN to reorder DEXA scanning this year  Chronic kidney disease, stage 3a ([place]) -     POCT URINALYSIS DIP (CLINITEK) Patient has stage IIIa chronic kidney disease due to hypertension and diabetes and has been stable and doing well we will continue present management. Gastroesophageal reflux disease with esophagitis without hemorrhage Plan of care was formulated today.  She is doing well.  A plan of care was formulated using patient exam, tests and other sources to optimize care using evidence based information.  Recommend no smoking, no eating after supper, avoid fatty foods, elevate Head of bed, avoid tight fitting clothing.  Continue on omeprazole . Iron deficiency anemia, unspecified iron deficiency anemia type Patient has chronic iron deficiency anemia is presently on iron supplements we will recheck this visit to see if she needs to  continue.  Vitamin D insufficiency Patient has chronic vitamin D insufficiency and has been taking vitamin D supplements.  We  will check her vitamin D level.    Orders Placed This Encounter  Procedures  . CBC with Differential/Platelet  . Comprehensive metabolic panel  . Lipid panel  . TSH  . POCT URINALYSIS DIP (CLINITEK)      I spent 30 minutes dedicated to the care of this patient on the date of this encounter to include face-to-face time with the patient, as well as: We reviewed the old records as well as some of her records from the GYN to establish whether she is has her DEXA and mammograms.  Apparently these are all up-to-date.  Follow-up: Return in about 6 months (around 09/17/2020) for fasting.  An After Visit Summary was printed and given to the patient.  Reinaldo Meeker, MD [facility] [phone]

## 2020-03-21 LAB — TSH: TSH: 2.08 u[IU]/mL (ref 0.450–4.500)

## 2020-03-21 LAB — CBC WITH DIFFERENTIAL/PLATELET
Basophils Absolute: 0.1 10*3/uL (ref 0.0–0.2)
Basos: 1 %
EOS (ABSOLUTE): 0.1 10*3/uL (ref 0.0–0.4)
Eos: 1 %
Hematocrit: 38.3 % (ref 34.0–46.6)
Hemoglobin: 12.7 g/dL (ref 11.1–15.9)
Immature Grans (Abs): 0 10*3/uL (ref 0.0–0.1)
Immature Granulocytes: 0 %
Lymphocytes Absolute: 2.4 10*3/uL (ref 0.7–3.1)
Lymphs: 26 %
MCH: 30.9 pg (ref 26.6–33.0)
MCHC: 33.2 g/dL (ref 31.5–35.7)
MCV: 93 fL (ref 79–97)
Monocytes Absolute: 0.6 10*3/uL (ref 0.1–0.9)
Monocytes: 6 %
Neutrophils Absolute: 6.2 10*3/uL (ref 1.4–7.0)
Neutrophils: 66 %
Platelets: 361 10*3/uL (ref 150–450)
RBC: 4.11 x10E6/uL (ref 3.77–5.28)
RDW: 12.9 % (ref 11.7–15.4)
WBC: 9.3 10*3/uL (ref 3.4–10.8)

## 2020-03-21 LAB — COMPREHENSIVE METABOLIC PANEL
ALT: 20 IU/L (ref 0–32)
AST: 29 IU/L (ref 0–40)
Albumin/Globulin Ratio: 1.8 (ref 1.2–2.2)
Albumin: 5.1 g/dL — ABNORMAL HIGH (ref 3.6–4.6)
Alkaline Phosphatase: 65 IU/L (ref 44–121)
BUN/Creatinine Ratio: 15 (ref 12–28)
BUN: 15 mg/dL (ref 8–27)
Bilirubin Total: 0.5 mg/dL (ref 0.0–1.2)
CO2: 20 mmol/L (ref 20–29)
Calcium: 10.4 mg/dL — ABNORMAL HIGH (ref 8.7–10.3)
Chloride: 100 mmol/L (ref 96–106)
Creatinine, Ser: 1.03 mg/dL — ABNORMAL HIGH (ref 0.57–1.00)
GFR calc Af Amer: 58 mL/min/{1.73_m2} — ABNORMAL LOW (ref >59)
GFR calc non Af Amer: 50 mL/min/{1.73_m2} — ABNORMAL LOW (ref >59)
Globulin, Total: 2.9 g/dL (ref 1.5–4.5)
Glucose: 92 mg/dL (ref 65–99)
Potassium: 4.8 mmol/L (ref 3.5–5.2)
Sodium: 139 mmol/L (ref 134–144)
Total Protein: 8 g/dL (ref 6.0–8.5)

## 2020-03-21 LAB — LIPID PANEL
Chol/HDL Ratio: 2.2 ratio (ref 0.0–4.4)
Cholesterol, Total: 207 mg/dL — ABNORMAL HIGH (ref 100–199)
HDL: 93 mg/dL (ref >39)
LDL Chol Calc (NIH): 97 mg/dL (ref 0–99)
Triglycerides: 99 mg/dL (ref 0–149)
VLDL Cholesterol Cal: 17 mg/dL (ref 5–40)

## 2020-03-21 LAB — CARDIOVASCULAR RISK ASSESSMENT

## 2020-03-21 NOTE — Progress Notes (Signed)
CBC normal, renal functions stage 3 stable, calcium high, cholesterol ok, TSH 2.080.  with high calcium, I would stop vitamin D.  Draw PTN level lp

## 2020-03-22 ENCOUNTER — Other Ambulatory Visit: Payer: Self-pay

## 2020-03-23 ENCOUNTER — Other Ambulatory Visit: Payer: Medicare Other

## 2020-03-23 ENCOUNTER — Other Ambulatory Visit: Payer: Self-pay

## 2020-03-23 ENCOUNTER — Encounter: Payer: Self-pay | Admitting: Legal Medicine

## 2020-03-23 ENCOUNTER — Telehealth: Payer: Self-pay

## 2020-03-23 DIAGNOSIS — Z961 Presence of intraocular lens: Secondary | ICD-10-CM | POA: Diagnosis not present

## 2020-03-23 DIAGNOSIS — H278 Other specified disorders of lens: Secondary | ICD-10-CM | POA: Diagnosis not present

## 2020-03-23 NOTE — Telephone Encounter (Signed)
error 

## 2020-03-24 LAB — PARATHYROID HORMONE, INTACT (NO CA): PTH: 50 pg/mL (ref 15–65)

## 2020-03-24 NOTE — Progress Notes (Signed)
PTH is normal, this is not the reason for calcium being high lp

## 2020-03-27 ENCOUNTER — Telehealth: Payer: Self-pay

## 2020-03-27 NOTE — Telephone Encounter (Signed)
Left voicemail with information on daughter's cell.

## 2020-03-27 NOTE — Telephone Encounter (Signed)
Patients daughter called and was asking if her taking Fosamax would cause her calcium to be elevated. Please advise

## 2020-03-27 NOTE — Telephone Encounter (Signed)
Fosamax should not cause hypercalcemia, do not take any calcium supplements for now. lp

## 2020-03-29 ENCOUNTER — Other Ambulatory Visit: Payer: Self-pay

## 2020-03-29 ENCOUNTER — Encounter: Payer: Self-pay | Admitting: Sports Medicine

## 2020-03-29 ENCOUNTER — Ambulatory Visit (INDEPENDENT_AMBULATORY_CARE_PROVIDER_SITE_OTHER): Payer: Medicare Other | Admitting: Sports Medicine

## 2020-03-29 DIAGNOSIS — M79674 Pain in right toe(s): Secondary | ICD-10-CM | POA: Diagnosis not present

## 2020-03-29 DIAGNOSIS — M21619 Bunion of unspecified foot: Secondary | ICD-10-CM | POA: Diagnosis not present

## 2020-03-29 DIAGNOSIS — M2040 Other hammer toe(s) (acquired), unspecified foot: Secondary | ICD-10-CM | POA: Diagnosis not present

## 2020-03-29 DIAGNOSIS — B351 Tinea unguium: Secondary | ICD-10-CM

## 2020-03-29 DIAGNOSIS — L84 Corns and callosities: Secondary | ICD-10-CM

## 2020-03-29 DIAGNOSIS — M79675 Pain in left toe(s): Secondary | ICD-10-CM

## 2020-03-29 NOTE — Progress Notes (Signed)
Subjective: Karen Briggs is a 85 y.o. female patient seen today in office with complaint of mildly painful thickened and elongated toenails; unable to trim. Patient denies history of Diabetes, Neuropathy, or Vascular disease and assisted by daughter who reports that her nails have gotten long and the big toe on the right foot is concerning because it is very dark thick and difficult to trim and also reports on the same foot that she has pain occasionally in between the toes worse with shoes. Patient has no other pedal complaints at this time.   No other pedal complaints noted.  Patient Active Problem List   Diagnosis Date Noted  . Osteoporosis 12/07/2019  . BMI 26.0-26.9,adult 09/16/2019  . Plantar wart 09/15/2019  . Esophageal reflux 05/31/2019  . Chronic kidney disease, stage 3a ([place]) 05/31/2019  . Vitamin D insufficiency 05/31/2019  . Iron deficiency anemia 05/31/2019  . Palpitations 10/17/2017  . Essential hypertension 10/17/2017  . Mixed hyperlipidemia 10/17/2017  . Other specified hypothyroidism 10/17/2017    Current Outpatient Medications on File Prior to Visit  Medication Sig Dispense Refill  . acetaminophen (TYLENOL) 500 MG tablet Take 500 mg by mouth as needed.    Marland Kitchen alendronate (FOSAMAX) 70 MG tablet Take 1 tablet (70 mg total) by mouth every 7 (seven) days. Take with a full glass of water on an empty stomach. 4 tablet 11  . aspirin EC 81 MG tablet Take 81 mg by mouth daily.    . Cholecalciferol (VITAMIN D3) 50 MCG (2000 UT) TABS Take 1 tablet by mouth daily. 90 tablet 2  . FEROSUL 325 (65 Fe) MG tablet TAKE 2 TABLETS EVERY DAY 180 tablet 3  . levothyroxine (SYNTHROID) 75 MCG tablet Take 1 tablet (75 mcg total) by mouth daily before breakfast. 90 tablet 2  . lisinopril (ZESTRIL) 10 MG tablet Take 1 tablet (10 mg total) by mouth daily. 90 tablet 1  . metoprolol tartrate (LOPRESSOR) 25 MG tablet Take 1 tablet (25 mg total) by mouth 2 (two) times daily. 180 tablet 2  . Multiple  Vitamin (MULTIVITAMIN) capsule Take 1 capsule by mouth daily.    Marland Kitchen omeprazole (PRILOSEC) 20 MG capsule Take 1 capsule (20 mg total) by mouth daily. 90 capsule 2  . simvastatin (ZOCOR) 40 MG tablet Take 1 tablet (40 mg total) by mouth daily. 90 tablet 2   No current facility-administered medications on file prior to visit.    No Known Allergies  Objective: Physical Exam  General: Well developed, nourished, no acute distress, awake, alert and oriented x 3  Vascular: Dorsalis pedis artery 1/4 bilateral, Posterior tibial artery 1/4 bilateral, skin temperature warm to warm proximal to distal bilateral lower extremities, mild varicosities, minimal pedal hair present bilateral.  Neurological: Gross sensation present via light touch bilateral.   Dermatological: Skin is warm, dry, and supple bilateral, Nails 1-10 are tender, long, thick, and discolored with mild subungal debris with the right hallux nail being worse, no webspace macerations present bilateral, no open lesions present bilateral there is corns/hyperkeratotic tissue present, first interspace on the right at the lateral first toe and the medial second toe no signs of infection bilateral.  Musculoskeletal: Asymptomatic bunion and hammertoe boney deformities noted bilateral. Muscular strength within normal limits without painon range of motion. No pain with calf compression bilateral.  Assessment and Plan:  Problem List Items Addressed This Visit    None    Visit Diagnoses    Pain due to onychomycosis of toenails of both feet    -  Primary   Corn of toe       Bunion       Hammer toe, unspecified laterality       Toe pain, right          -Examined patient.  -Discussed treatment options for painful mycotic nails. -Mechanically debrided and reduced mycotic nails with sterile nail nipper and dremel nail file without incident. -Mechanically debrided keratosis at first interspace on the right foot using a sterile 15 blade without  incident -Dispensed toe spacers to use as instructed on right foot -Recommend Vicks VapoRub to toenails and if patient needs to return to the office she will need a ABN signed for routine nail care -Patient to return as needed or sooner if symptoms worsen.  Landis Martins, DPM

## 2020-04-18 DIAGNOSIS — N183 Chronic kidney disease, stage 3 unspecified: Secondary | ICD-10-CM | POA: Diagnosis not present

## 2020-04-24 DIAGNOSIS — D631 Anemia in chronic kidney disease: Secondary | ICD-10-CM | POA: Diagnosis not present

## 2020-04-24 DIAGNOSIS — E559 Vitamin D deficiency, unspecified: Secondary | ICD-10-CM | POA: Diagnosis not present

## 2020-04-24 DIAGNOSIS — N289 Disorder of kidney and ureter, unspecified: Secondary | ICD-10-CM | POA: Insufficient documentation

## 2020-04-24 DIAGNOSIS — N1831 Chronic kidney disease, stage 3a: Secondary | ICD-10-CM | POA: Diagnosis not present

## 2020-04-24 DIAGNOSIS — I129 Hypertensive chronic kidney disease with stage 1 through stage 4 chronic kidney disease, or unspecified chronic kidney disease: Secondary | ICD-10-CM | POA: Diagnosis not present

## 2020-04-26 ENCOUNTER — Ambulatory Visit: Payer: Medicare Other | Admitting: Cardiology

## 2020-04-28 ENCOUNTER — Other Ambulatory Visit: Payer: Self-pay | Admitting: Cardiology

## 2020-04-28 DIAGNOSIS — I10 Essential (primary) hypertension: Secondary | ICD-10-CM

## 2020-05-29 IMAGING — US US RENAL
1 series · 14 of 25 positions shown · non-contrast
Comparison: None.

CLINICAL DATA: 83-year-old female with chronic kidney disease stage
3. Initial encounter.

EXAM:
RENAL / URINARY TRACT ULTRASOUND COMPLETE

[Series 1: us renal · 0.19mm/px · 14 of 28 slices shown]
[im 1/28]
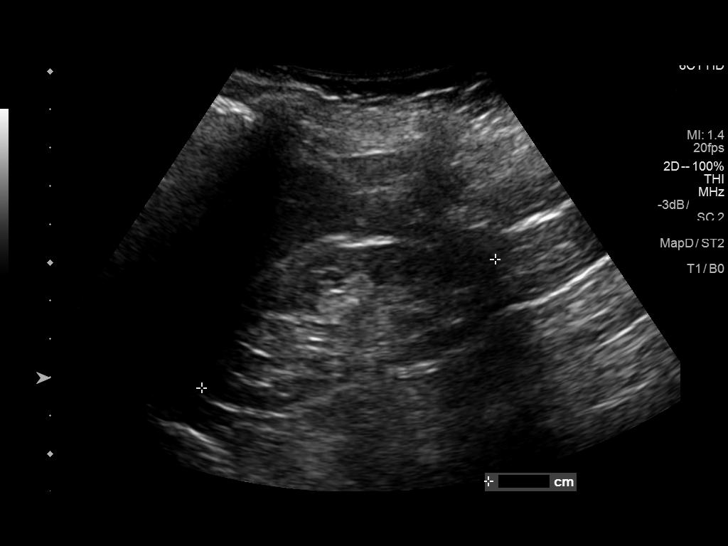
[im 3/28]
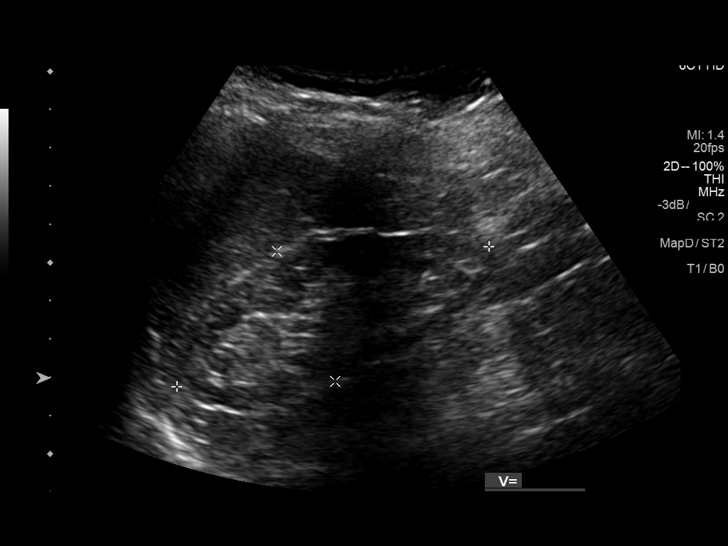
[im 5/28]
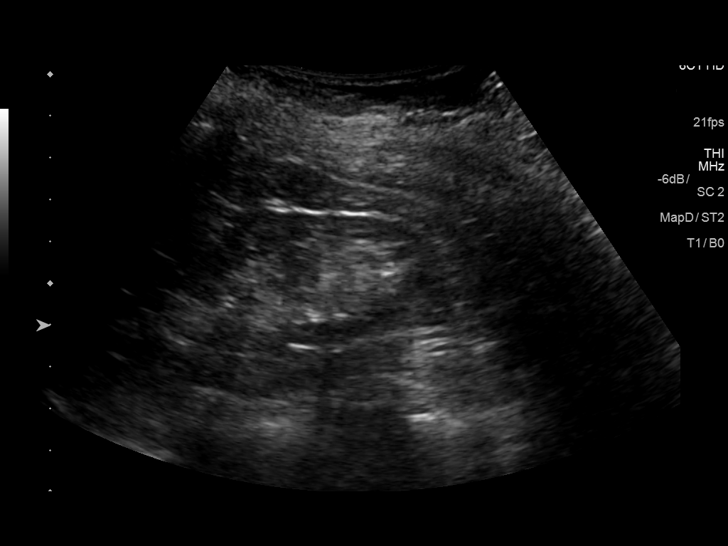
[im 7/28]
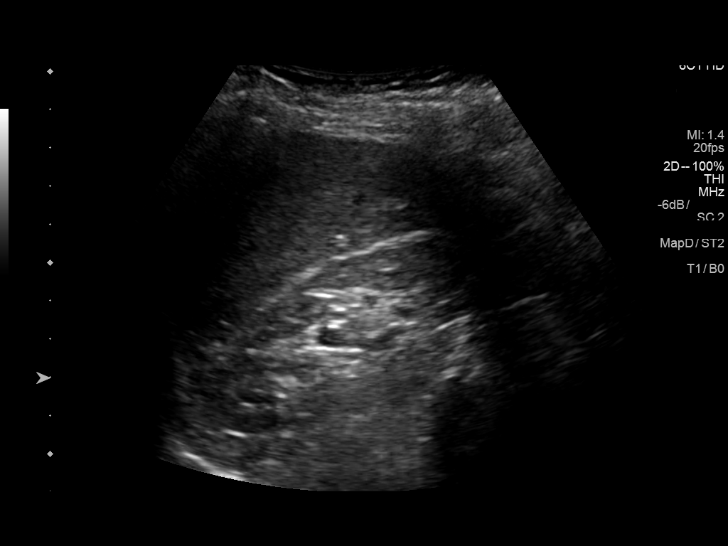
[im 10/28]
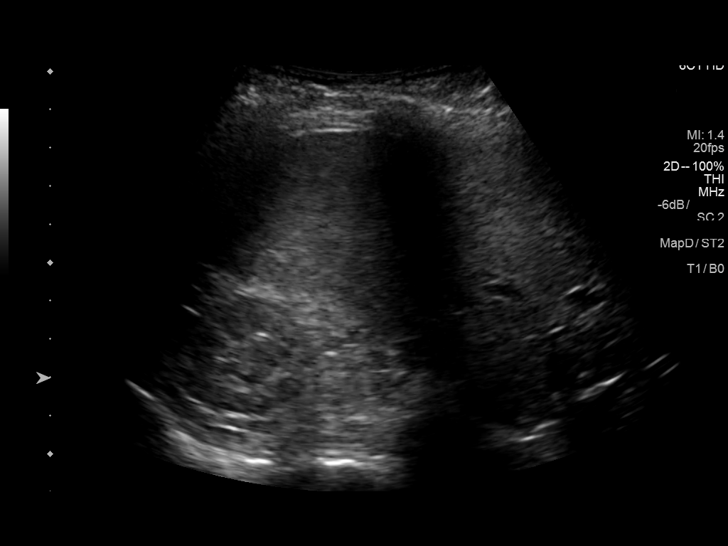
[im 11/28]
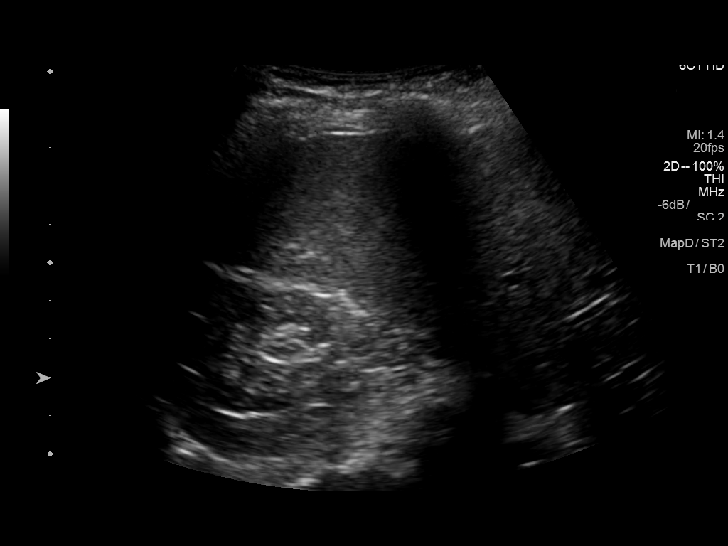
[im 13/28]
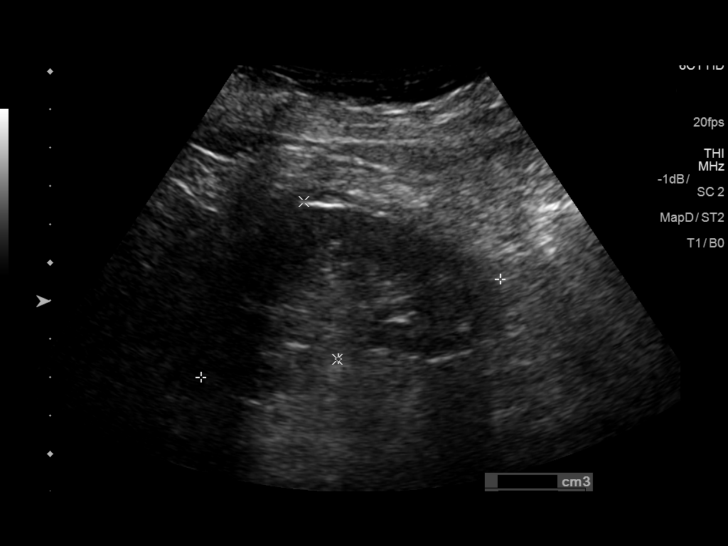
[im 15/28]
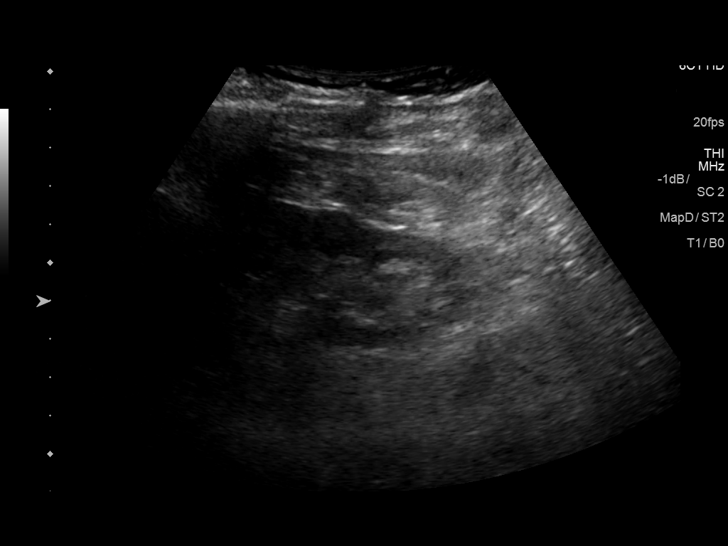
[im 17/28]
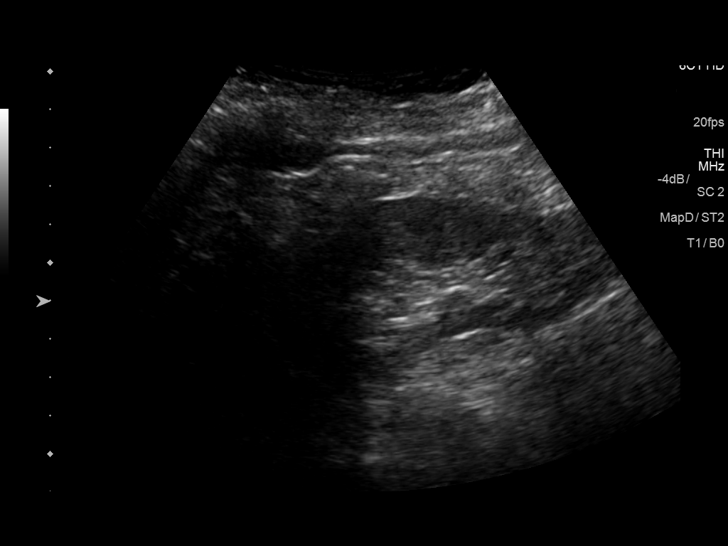
[im 19/28]
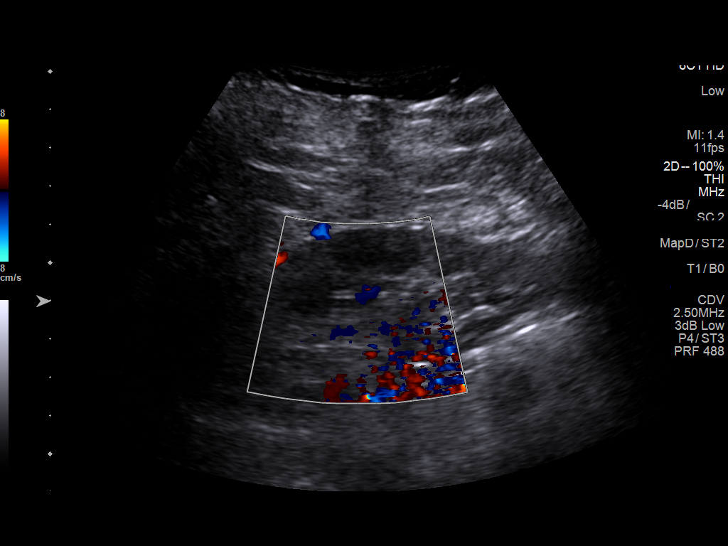
[im 21/28]
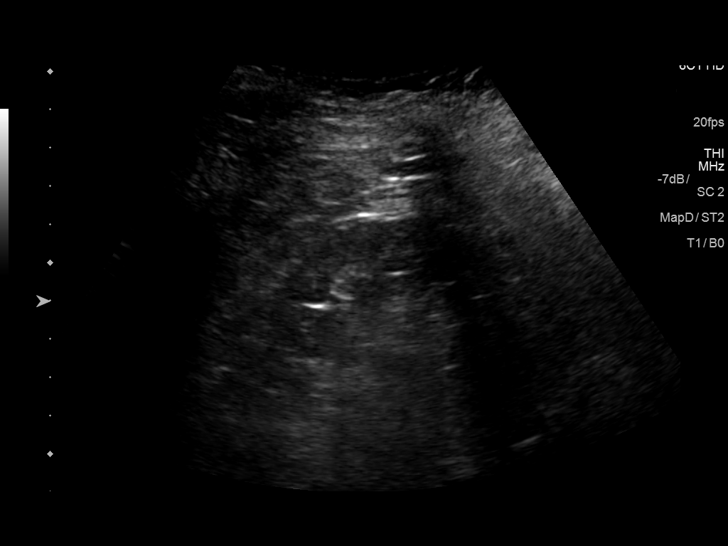
[im 23/28]
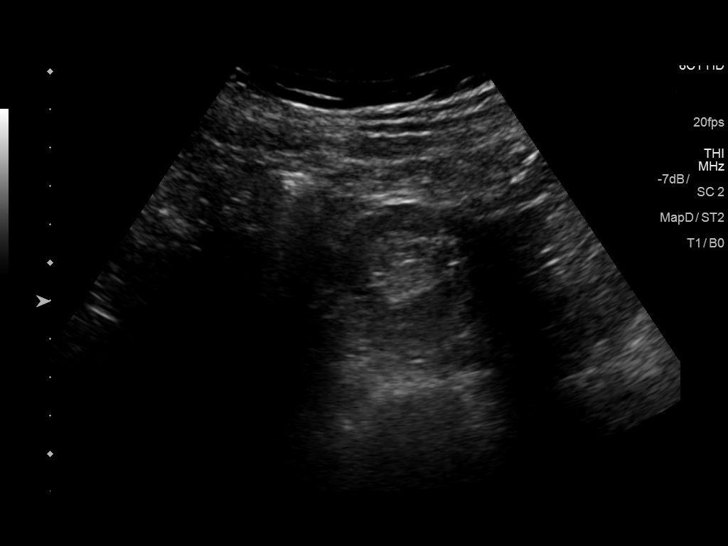
[im 25/28]
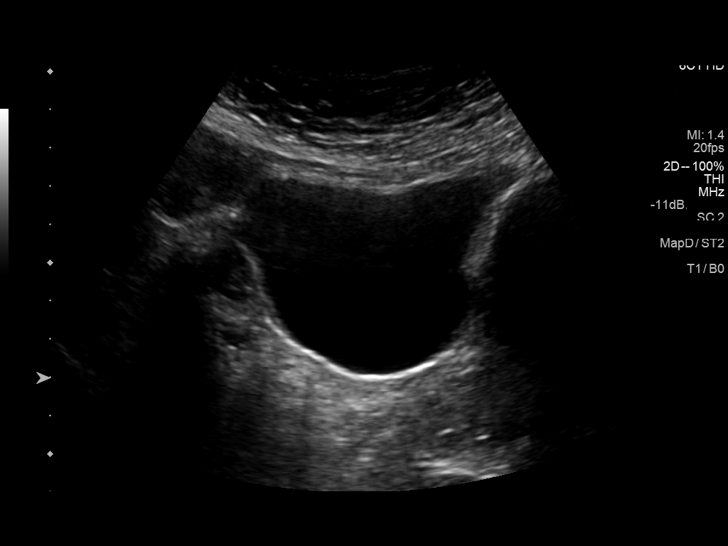
[im 28/28]
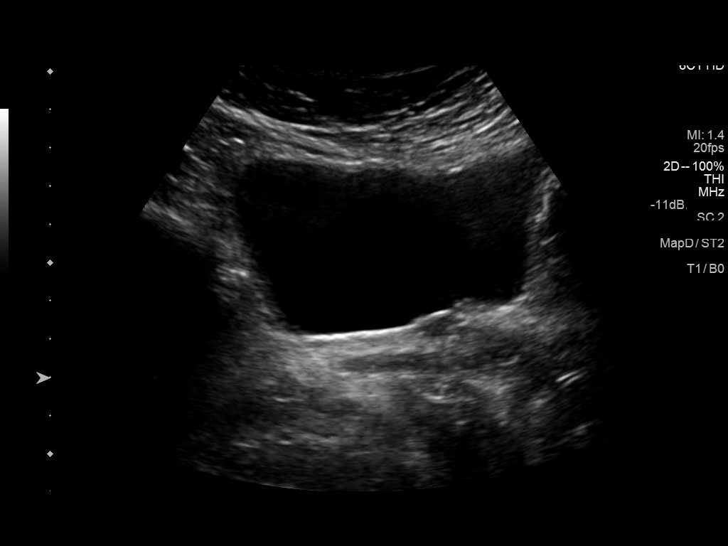

[14 of 25 positions shown; findings below may reference images not displayed]

FINDINGS: Right Kidney:

Renal measurements: 9 x 3.7 x 3.8 cm = volume: 67 mL . Echogenicity
within normal limits. No mass or hydronephrosis visualized.

Left Kidney:

Renal measurements: 8.2 x 4.2 x 4 cm = volume: 73 mL. Echogenicity
within normal limits. No mass or hydronephrosis visualized.

Bladder:

Appears normal for degree of bladder distention.
IMPRESSION: 1. No hydronephrosis.
2. Kidneys slightly small in size.

## 2020-06-07 ENCOUNTER — Other Ambulatory Visit: Payer: Self-pay

## 2020-06-07 DIAGNOSIS — M810 Age-related osteoporosis without current pathological fracture: Secondary | ICD-10-CM

## 2020-06-07 MED ORDER — ALENDRONATE SODIUM 70 MG PO TABS: 70.0000 mg | ORAL_TABLET | ORAL | 11 refills | Status: DC

## 2020-06-12 DIAGNOSIS — H26491 Other secondary cataract, right eye: Secondary | ICD-10-CM | POA: Diagnosis not present

## 2020-06-30 ENCOUNTER — Other Ambulatory Visit: Payer: Self-pay | Admitting: Legal Medicine

## 2020-06-30 DIAGNOSIS — K2100 Gastro-esophageal reflux disease with esophagitis, without bleeding: Secondary | ICD-10-CM

## 2020-06-30 DIAGNOSIS — I10 Essential (primary) hypertension: Secondary | ICD-10-CM

## 2020-06-30 DIAGNOSIS — E782 Mixed hyperlipidemia: Secondary | ICD-10-CM

## 2020-06-30 DIAGNOSIS — E039 Hypothyroidism, unspecified: Secondary | ICD-10-CM

## 2020-07-06 ENCOUNTER — Other Ambulatory Visit: Payer: Self-pay

## 2020-07-06 DIAGNOSIS — E782 Mixed hyperlipidemia: Secondary | ICD-10-CM

## 2020-07-06 MED ORDER — SIMVASTATIN 40 MG PO TABS: 40.0000 mg | ORAL_TABLET | Freq: Every day | ORAL | 2 refills | Status: DC

## 2020-07-10 DIAGNOSIS — H26491 Other secondary cataract, right eye: Secondary | ICD-10-CM | POA: Diagnosis not present

## 2020-07-11 ENCOUNTER — Other Ambulatory Visit: Payer: Self-pay

## 2020-07-11 ENCOUNTER — Encounter: Payer: Self-pay | Admitting: Cardiology

## 2020-07-11 ENCOUNTER — Ambulatory Visit (INDEPENDENT_AMBULATORY_CARE_PROVIDER_SITE_OTHER): Payer: Medicare Other | Admitting: Cardiology

## 2020-07-11 VITALS — BP 136/62 | HR 75 | Ht 65.5 in | Wt 137.0 lb

## 2020-07-11 DIAGNOSIS — Z6826 Body mass index (BMI) 26.0-26.9, adult: Secondary | ICD-10-CM

## 2020-07-11 DIAGNOSIS — R002 Palpitations: Secondary | ICD-10-CM

## 2020-07-11 DIAGNOSIS — E782 Mixed hyperlipidemia: Secondary | ICD-10-CM

## 2020-07-11 DIAGNOSIS — N289 Disorder of kidney and ureter, unspecified: Secondary | ICD-10-CM | POA: Diagnosis not present

## 2020-07-11 DIAGNOSIS — I10 Essential (primary) hypertension: Secondary | ICD-10-CM | POA: Diagnosis not present

## 2020-07-11 NOTE — Patient Instructions (Signed)

## 2020-07-11 NOTE — Progress Notes (Signed)
Cardiology Office Note:    Date:  07/11/2020   ID:  Tareka Jhaveri, DOB [birth year], MRN [id number]  PCP:  Lillard Anes, MD  Cardiologist:  Jenne Campus, MD    Referring MD: Lillard Anes,*   Chief Complaint  Patient presents with  . Follow-up  I am doing fine  History of Present Illness:    Karen Briggs is a 86 y.o. female with past medical history significant for palpitations, essential hypertension.  Comes today to my office for follow-up.  Overall doing well biggest complaint she has is anxiety.  She is worried about a lot of issues.  No dizziness no passing out no shortness of breath no chest pain.  Past Medical History:  Diagnosis Date  . BMI 26.0-26.9,adult 09/16/2019  . Chronic kidney disease, stage 3a ([place]) 05/31/2019  . Essential hypertension 10/17/2017  . Iron deficiency anemia 05/31/2019  . Kidney disease    Stage 3  . Mixed hyperlipidemia 10/17/2017  . Osteoporosis 12/07/2019  . Other specified hypothyroidism 10/17/2017  . Palpitations 10/17/2017  . Plantar wart 09/15/2019  . Vitamin D insufficiency 05/31/2019    Past Surgical History:  Procedure Laterality Date  . CHOLECYSTECTOMY  2001    Current Medications: Current Meds  Medication Sig  . acetaminophen (TYLENOL) 500 MG tablet Take 500 mg by mouth as needed for moderate pain.  Marland Kitchen alendronate (FOSAMAX) 70 MG tablet Take 1 tablet (70 mg total) by mouth every 7 (seven) days. Take with a full glass of water on an empty stomach.  Marland Kitchen aspirin EC 81 MG tablet Take 81 mg by mouth daily.  Marland Kitchen levothyroxine (SYNTHROID) 75 MCG tablet TAKE 1 TABLET EVERY DAY BEFORE BREAKFAST  . lisinopril (ZESTRIL) 10 MG tablet TAKE 1 TABLET EVERY DAY  . metoprolol tartrate (LOPRESSOR) 25 MG tablet TAKE 1 TABLET TWICE DAILY  . Multiple Vitamin (MULTIVITAMIN) capsule Take 1 capsule by mouth daily.  Marland Kitchen omeprazole (PRILOSEC) 20 MG capsule TAKE 1 CAPSULE EVERY DAY  . simvastatin (ZOCOR) 40 MG tablet Take 1 tablet (40 mg total) by  mouth daily.  . [DISCONTINUED] Cholecalciferol (VITAMIN D3) 50 MCG (2000 UT) TABS Take 1 tablet by mouth daily.  . [DISCONTINUED] FEROSUL 325 (65 Fe) MG tablet TAKE 2 TABLETS EVERY DAY     Allergies:   Patient has no known allergies.   Social History   Socioeconomic History  . Marital status: Widowed    Spouse name: Not on file  . Number of children: 2  . Years of education: Not on file  . Highest education level: Not on file  Occupational History  . Occupation: Retired  Tobacco Use  . Smoking status: Never Smoker  . Smokeless tobacco: Never Used  Substance and Sexual Activity  . Alcohol use: Not Currently  . Drug use: Never  . Sexual activity: Not Currently  Other Topics Concern  . Not on file  Social History Narrative  . Not on file   Social Determinants of Health   Financial Resource Strain: Not on file  Food Insecurity: Not on file  Transportation Needs: Not on file  Physical Activity: Not on file  Stress: Not on file  Social Connections: Not on file     Family History: The patient's family history includes Heart attack in her mother; Leukemia in her father. ROS:   Please see the history of present illness.    All 14 point review of systems negative except as described per history of present illness  EKGs/Labs/Other Studies Reviewed:  Recent Labs: 03/20/2020: ALT 20; BUN 15; Creatinine, Ser 1.03; Hemoglobin 12.7; Platelets 361; Potassium 4.8; Sodium 139; TSH 2.080  Recent Lipid Panel    Component Value Date/Time   CHOL 207 (H) 03/20/2020 0946   TRIG 99 03/20/2020 0946   HDL 93 03/20/2020 0946   CHOLHDL 2.2 03/20/2020 0946   LDLCALC 97 03/20/2020 0946    Physical Exam:    VS:  BP 136/62 (BP Location: Left Arm, Patient Position: Sitting)   Pulse 75   Ht 5' 5.5" (1.664 m)   Wt 137 lb (62.1 kg)   SpO2 97%   BMI 22.45 kg/m     Wt Readings from Last 3 Encounters:  07/11/20 137 lb (62.1 kg)  03/20/20 132 lb 9.6 oz (60.1 kg)  10/26/19 138 lb  3.2 oz (62.7 kg)     GEN:  Well nourished, well developed in no acute distress HEENT: Normal NECK: No JVD; No carotid bruits LYMPHATICS: No lymphadenopathy CARDIAC: RRR, no murmurs, no rubs, no gallops RESPIRATORY:  Clear to auscultation without rales, wheezing or rhonchi  ABDOMEN: Soft, non-tender, non-distended MUSCULOSKELETAL:  No edema; No deformity  SKIN: Warm and dry LOWER EXTREMITIES: no swelling NEUROLOGIC:  Alert and oriented x 3 PSYCHIATRIC:  Normal affect   ASSESSMENT:    1. Essential hypertension   2. Mixed hyperlipidemia   3. Palpitations   4. Kidney disease   5. BMI 26.0-26.9,adult    PLAN:    In order of problems listed above:  1. Essential hypertension blood pressure well controlled continue present management. 2. Mixed dyslipidemia I did review her K PN which show me her HDL of 93 and LDL of 97.  This is acceptable for her age and risk factors.  I will continue present management. 3. Palpitations there is a successfully suppressed with a beta-blocker which I will continue. 4. Kidney disease I did review her last creatinine from December 28 which was 1.01 is a normal number. 5. She had 2 questions questionable 1 of [place] if acetaminophen is safe for her heart and I told her yes it should be no problem.  Second question she had to [place] if she can still continue using aspirin 81 mg as cardioprotective medication.  I did calculate her predicted 10 years risk which is more than 33% and I told her that in his situation since there is no bleeding history it will be reasonable to continue.   Medication Adjustments/Labs and Tests Ordered: Current medicines are reviewed at length with the patient today.  Concerns regarding medicines are outlined above.  No orders of the defined types were placed in this encounter.  Medication changes: No orders of the defined types were placed in this encounter.   Signed, Park Liter, MD, [facility] 07/11/2020 4:43 PM    [place]

## 2020-08-07 ENCOUNTER — Encounter: Payer: Self-pay | Admitting: Legal Medicine

## 2020-08-07 ENCOUNTER — Other Ambulatory Visit: Payer: Self-pay

## 2020-08-07 ENCOUNTER — Ambulatory Visit (INDEPENDENT_AMBULATORY_CARE_PROVIDER_SITE_OTHER): Payer: Medicare Other | Admitting: Legal Medicine

## 2020-08-07 VITALS — BP 140/80 | HR 81 | Temp 97.5°F | Resp 16 | Ht 65.5 in | Wt 125.0 lb

## 2020-08-07 DIAGNOSIS — R109 Unspecified abdominal pain: Secondary | ICD-10-CM | POA: Insufficient documentation

## 2020-08-07 DIAGNOSIS — R1084 Generalized abdominal pain: Secondary | ICD-10-CM

## 2020-08-07 DIAGNOSIS — K529 Noninfective gastroenteritis and colitis, unspecified: Secondary | ICD-10-CM | POA: Diagnosis not present

## 2020-08-07 NOTE — Progress Notes (Signed)
Subjective:  Patient ID: Karen Briggs, female    DOB: 02-18-1935  Age: 86 y.o. MRN: [id number]  Chief Complaint  Patient presents with  . Diarrhea    Since 2 months ago in the morning and 2-3 times per day.    HPI: patient has had continues loose BM for 2 months, she has well water, BM once a day.  No cramping but has some burning in stomach. She lost 20 lbs last years but has stabi;ized. She has discomfort in pelvic area.   Current Outpatient Medications on File Prior to Visit  Medication Sig Dispense Refill  . acetaminophen (TYLENOL) 500 MG tablet Take 500 mg by mouth as needed for moderate pain.    Marland Kitchen alendronate (FOSAMAX) 70 MG tablet Take 1 tablet (70 mg total) by mouth every 7 (seven) days. Take with a full glass of water on an empty stomach. 4 tablet 11  . aspirin EC 81 MG tablet Take 81 mg by mouth daily.    Marland Kitchen levothyroxine (SYNTHROID) 75 MCG tablet TAKE 1 TABLET EVERY DAY BEFORE BREAKFAST 90 tablet 2  . lisinopril (ZESTRIL) 10 MG tablet TAKE 1 TABLET EVERY DAY 90 tablet 1  . metoprolol tartrate (LOPRESSOR) 25 MG tablet TAKE 1 TABLET TWICE DAILY 180 tablet 2  . Multiple Vitamin (MULTIVITAMIN) capsule Take 1 capsule by mouth daily.    Marland Kitchen omeprazole (PRILOSEC) 20 MG capsule TAKE 1 CAPSULE EVERY DAY 90 capsule 2  . simvastatin (ZOCOR) 40 MG tablet Take 1 tablet (40 mg total) by mouth daily. 90 tablet 2   No current facility-administered medications on file prior to visit.   Past Medical History:  Diagnosis Date  . BMI 26.0-26.9,adult 09/16/2019  . Chronic kidney disease, stage 3a ([place]) 05/31/2019  . Essential hypertension 10/17/2017  . Iron deficiency anemia 05/31/2019  . Kidney disease    Stage 3  . Mixed hyperlipidemia 10/17/2017  . Osteoporosis 12/07/2019  . Other specified hypothyroidism 10/17/2017  . Palpitations 10/17/2017  . Plantar wart 09/15/2019  . Vitamin D insufficiency 05/31/2019   Past Surgical History:  Procedure Laterality Date  . CHOLECYSTECTOMY  2001    Family  History  Problem Relation Age of Onset  . Heart attack Mother   . Leukemia Father    Social History   Socioeconomic History  . Marital status: Widowed    Spouse name: Not on file  . Number of children: 2  . Years of education: Not on file  . Highest education level: Not on file  Occupational History  . Occupation: Retired  Tobacco Use  . Smoking status: Never Smoker  . Smokeless tobacco: Never Used  Substance and Sexual Activity  . Alcohol use: Not Currently  . Drug use: Never  . Sexual activity: Not Currently  Other Topics Concern  . Not on file  Social History Narrative  . Not on file   Social Determinants of Health   Financial Resource Strain: Not on file  Food Insecurity: Not on file  Transportation Needs: Not on file  Physical Activity: Not on file  Stress: Not on file  Social Connections: Not on file    Review of Systems  Constitutional: Negative for activity change and appetite change.  HENT: Negative for congestion and sinus pain.   Eyes: Negative for visual disturbance.  Respiratory: Negative for chest tightness and shortness of breath.   Cardiovascular: Negative for chest pain, palpitations and leg swelling.  Gastrointestinal: Negative for abdominal distention and abdominal pain.  Genitourinary: Negative.  Musculoskeletal: Negative for arthralgias and back pain.  Neurological: Negative.   Psychiatric/Behavioral: Negative.      Objective:  BP 140/80   Pulse 81   Temp (!) 97.5 F (36.4 C)   Resp 16   Ht 5' 5.5" (1.664 m)   Wt 125 lb (56.7 kg)   SpO2 98%   BMI 20.48 kg/m   BP/Weight 08/07/2020 07/11/2020 93/81/0175  Systolic BP 102 585 277  Diastolic BP 80 62 68  Wt. (Lbs) 125 137 132.6  BMI 20.48 22.45 25.05    Physical Exam Vitals reviewed.  Constitutional:      Appearance: Normal appearance. She is normal weight.  HENT:     Head: Normocephalic and atraumatic.     Right Ear: Tympanic membrane normal.     Left Ear: Tympanic membrane  normal.     Nose: Nose normal.     Mouth/Throat:     Mouth: Mucous membranes are moist.     Pharynx: Oropharynx is clear.  Eyes:     Extraocular Movements: Extraocular movements intact.     Conjunctiva/sclera: Conjunctivae normal.     Pupils: Pupils are equal, round, and reactive to light.  Cardiovascular:     Rate and Rhythm: Normal rate and regular rhythm.     Pulses: Normal pulses.     Heart sounds: Normal heart sounds. No murmur heard. No gallop.   Pulmonary:     Effort: Pulmonary effort is normal. No respiratory distress.     Breath sounds: Normal breath sounds. No rales.  Abdominal:     General: Abdomen is flat. Bowel sounds are normal. There is no distension.     Palpations: Abdomen is soft.     Tenderness: There is no abdominal tenderness.  Musculoskeletal:        General: Normal range of motion.     Cervical back: Normal range of motion and neck supple.  Skin:    General: Skin is warm and dry.     Capillary Refill: Capillary refill takes less than 2 seconds.  Neurological:     General: No focal deficit present.     Mental Status: She is alert and oriented to person, place, and time. Mental status is at baseline.       Lab Results  Component Value Date   WBC 9.3 03/20/2020   HGB 12.7 03/20/2020   HCT 38.3 03/20/2020   PLT 361 03/20/2020   GLUCOSE 92 03/20/2020   CHOL 207 (H) 03/20/2020   TRIG 99 03/20/2020   HDL 93 03/20/2020   LDLCALC 97 03/20/2020   ALT 20 03/20/2020   AST 29 03/20/2020   NA 139 03/20/2020   K 4.8 03/20/2020   CL 100 03/20/2020   CREATININE 1.03 (H) 03/20/2020   BUN 15 03/20/2020   CO2 20 03/20/2020   TSH 2.080 03/20/2020      Assessment & Plan:   Diagnoses and all orders for this visit: Chronic diarrhea -     CBC with Differential/Platelet -     Comprehensive metabolic panel -     Sedimentation rate -     Stool culture -     C difficile Toxins A+B W/Rflx -     CT Abdomen Pelvis W Contrast Patient has been having lose  stools for one month  Generalized abdominal pain Pain mostly in lower quadrants but no pain or rebound . She has small lipomas in abdominal fat after weight loss        Follow-up: Return in about  2 weeks (around 08/21/2020) for diarrhea.  An After Visit Summary was printed and given to the patient.  Reinaldo Meeker, MD [facility] [phone]

## 2020-08-08 ENCOUNTER — Telehealth: Payer: Self-pay

## 2020-08-08 LAB — COMPREHENSIVE METABOLIC PANEL
ALT: 24 IU/L (ref 0–32)
AST: 31 IU/L (ref 0–40)
Albumin/Globulin Ratio: 2.1 (ref 1.2–2.2)
Albumin: 5 g/dL — ABNORMAL HIGH (ref 3.6–4.6)
Alkaline Phosphatase: 49 IU/L (ref 44–121)
BUN/Creatinine Ratio: 15 (ref 12–28)
BUN: 15 mg/dL (ref 8–27)
Bilirubin Total: 0.4 mg/dL (ref 0.0–1.2)
CO2: 20 mmol/L (ref 20–29)
Calcium: 9.9 mg/dL (ref 8.7–10.3)
Chloride: 101 mmol/L (ref 96–106)
Creatinine, Ser: 1.01 mg/dL — ABNORMAL HIGH (ref 0.57–1.00)
Globulin, Total: 2.4 g/dL (ref 1.5–4.5)
Glucose: 90 mg/dL (ref 65–99)
Potassium: 5.1 mmol/L (ref 3.5–5.2)
Sodium: 141 mmol/L (ref 134–144)
Total Protein: 7.4 g/dL (ref 6.0–8.5)
eGFR: 55 mL/min/{1.73_m2} — ABNORMAL LOW (ref >59)

## 2020-08-08 LAB — CBC WITH DIFFERENTIAL/PLATELET
Basophils Absolute: 0.1 10*3/uL (ref 0.0–0.2)
Basos: 1 %
EOS (ABSOLUTE): 0.1 10*3/uL (ref 0.0–0.4)
Eos: 1 %
Hematocrit: 36.6 % (ref 34.0–46.6)
Hemoglobin: 12.4 g/dL (ref 11.1–15.9)
Immature Grans (Abs): 0 10*3/uL (ref 0.0–0.1)
Immature Granulocytes: 0 %
Lymphocytes Absolute: 3 10*3/uL (ref 0.7–3.1)
Lymphs: 32 %
MCH: 31.5 pg (ref 26.6–33.0)
MCHC: 33.9 g/dL (ref 31.5–35.7)
MCV: 93 fL (ref 79–97)
Monocytes Absolute: 0.6 10*3/uL (ref 0.1–0.9)
Monocytes: 6 %
Neutrophils Absolute: 5.6 10*3/uL (ref 1.4–7.0)
Neutrophils: 60 %
Platelets: 375 10*3/uL (ref 150–450)
RBC: 3.94 x10E6/uL (ref 3.77–5.28)
RDW: 13.3 % (ref 11.7–15.4)
WBC: 9.4 10*3/uL (ref 3.4–10.8)

## 2020-08-08 LAB — SEDIMENTATION RATE: Sed Rate: 15 mm/hr (ref 0–40)

## 2020-08-08 NOTE — Progress Notes (Signed)
Cbc normal, kidneys tests stage 3a, mild , liver tests normal, sed rate 15 normal no inflammation lp

## 2020-08-08 NOTE — Telephone Encounter (Signed)
PATIENT IS SCHEDULED FOR [place] FOR HER CT ABD PELVIS 08/10/2020 WHICH REQUIRES CONTAST SO SHE IS TO ARRIVE AT 1 PM TO START DRINKING THE CONTRAST FOR HER 3:00 APPOINTMENT. LEFT DETAILED MESSAGE FOR PATIENT INFORMING HER OF HER APPOINTMENT.

## 2020-08-10 DIAGNOSIS — K529 Noninfective gastroenteritis and colitis, unspecified: Secondary | ICD-10-CM | POA: Diagnosis not present

## 2020-08-10 DIAGNOSIS — K2970 Gastritis, unspecified, without bleeding: Secondary | ICD-10-CM | POA: Diagnosis not present

## 2020-08-10 DIAGNOSIS — K449 Diaphragmatic hernia without obstruction or gangrene: Secondary | ICD-10-CM | POA: Diagnosis not present

## 2020-08-10 DIAGNOSIS — K7689 Other specified diseases of liver: Secondary | ICD-10-CM | POA: Diagnosis not present

## 2020-08-14 DIAGNOSIS — K529 Noninfective gastroenteritis and colitis, unspecified: Secondary | ICD-10-CM | POA: Diagnosis not present

## 2020-08-18 LAB — STOOL CULTURE: E coli, Shiga toxin Assay: NEGATIVE

## 2020-08-18 LAB — C DIFFICILE TOXINS A+B W/RFLX

## 2020-08-18 NOTE — Progress Notes (Signed)
Cultures are now negative for salmonella, it has been treated lp

## 2020-08-20 NOTE — Progress Notes (Signed)
Cancelled test lp

## 2020-08-21 ENCOUNTER — Other Ambulatory Visit: Payer: Self-pay

## 2020-08-21 ENCOUNTER — Encounter: Payer: Self-pay | Admitting: Legal Medicine

## 2020-08-21 ENCOUNTER — Ambulatory Visit (INDEPENDENT_AMBULATORY_CARE_PROVIDER_SITE_OTHER): Payer: Medicare Other | Admitting: Legal Medicine

## 2020-08-21 VITALS — BP 124/64 | HR 75 | Temp 97.3°F | Ht 63.0 in | Wt 124.0 lb

## 2020-08-21 DIAGNOSIS — R300 Dysuria: Secondary | ICD-10-CM | POA: Diagnosis not present

## 2020-08-21 DIAGNOSIS — M810 Age-related osteoporosis without current pathological fracture: Secondary | ICD-10-CM

## 2020-08-21 DIAGNOSIS — K529 Noninfective gastroenteritis and colitis, unspecified: Secondary | ICD-10-CM | POA: Diagnosis not present

## 2020-08-21 LAB — POCT URINALYSIS DIP (CLINITEK)
Bilirubin, UA: NEGATIVE
Blood, UA: NEGATIVE
Glucose, UA: NEGATIVE mg/dL
Ketones, POC UA: NEGATIVE mg/dL
Nitrite, UA: NEGATIVE
POC PROTEIN,UA: NEGATIVE
Spec Grav, UA: 1.02 (ref 1.010–1.025)
Urobilinogen, UA: 0.2 E.U./dL
pH, UA: 6 (ref 5.0–8.0)

## 2020-08-21 NOTE — Progress Notes (Signed)
Subjective:  Patient ID: Karen Briggs, female    DOB: 19-Mar-1935  Age: 86 y.o. MRN: [id number]  Chief Complaint  Patient presents with  . Discuss test results    HPI: diarrhea has stopped.  fosamax may be making this worse.stop fosamax.  We reviewed labs and x-rays.some lower abdominal burning   Current Outpatient Medications on File Prior to Visit  Medication Sig Dispense Refill  . acetaminophen (TYLENOL) 500 MG tablet Take 500 mg by mouth as needed for moderate pain.    Marland Kitchen aspirin EC 81 MG tablet Take 81 mg by mouth daily.    Marland Kitchen levothyroxine (SYNTHROID) 75 MCG tablet TAKE 1 TABLET EVERY DAY BEFORE BREAKFAST 90 tablet 2  . lisinopril (ZESTRIL) 10 MG tablet TAKE 1 TABLET EVERY DAY 90 tablet 1  . metoprolol tartrate (LOPRESSOR) 25 MG tablet TAKE 1 TABLET TWICE DAILY 180 tablet 2  . Multiple Vitamin (MULTIVITAMIN) capsule Take 1 capsule by mouth daily.    Marland Kitchen omeprazole (PRILOSEC) 20 MG capsule TAKE 1 CAPSULE EVERY DAY 90 capsule 2  . simvastatin (ZOCOR) 40 MG tablet Take 1 tablet (40 mg total) by mouth daily. 90 tablet 2   No current facility-administered medications on file prior to visit.   Past Medical History:  Diagnosis Date  . BMI 26.0-26.9,adult 09/16/2019  . Chronic kidney disease, stage 3a ([place]) 05/31/2019  . Essential hypertension 10/17/2017  . Iron deficiency anemia 05/31/2019  . Kidney disease    Stage 3  . Mixed hyperlipidemia 10/17/2017  . Osteoporosis 12/07/2019  . Other specified hypothyroidism 10/17/2017  . Palpitations 10/17/2017  . Plantar wart 09/15/2019  . Vitamin D insufficiency 05/31/2019   Past Surgical History:  Procedure Laterality Date  . CHOLECYSTECTOMY  2001    Family History  Problem Relation Age of Onset  .  Heart attack Mother   . Leukemia Father    Social History   Socioeconomic History  . Marital status: Widowed    Spouse name: Not on file  . Number of children: 2  . Years of education: Not on file  . Highest education level: Not on file  Occupational History  . Occupation: Retired  Tobacco Use  . Smoking status: Never Smoker  . Smokeless tobacco: Never Used  Substance and Sexual Activity  . Alcohol use: Not Currently  . Drug use: Never  . Sexual activity: Not Currently  Other Topics Concern  . Not on file  Social History Narrative  . Not on file   Social Determinants of Health   Financial Resource Strain: Not on file  Food Insecurity: Not on file  Transportation Needs: Not on file  Physical Activity: Not on file  Stress: Not on file  Social Connections: Not on file    Review of Systems  Constitutional: Negative for chills, fatigue and fever.  HENT: Negative for congestion, ear pain, rhinorrhea and sore throat.   Respiratory: Negative for cough and shortness of breath.   Cardiovascular: Negative for chest pain.  Gastrointestinal: Negative for abdominal distention and abdominal pain.  Genitourinary: Negative for difficulty urinating, dysuria and urgency.  Musculoskeletal: Negative for arthralgias and back pain.  Neurological: Negative.   Psychiatric/Behavioral: The patient is nervous/anxious.      Objective:  BP 124/64   Pulse 75   Temp (!) 97.3 F (36.3 C)   Ht 5\' 3"  (1.6 m)   Wt 124 lb (56.2 kg)   SpO2 99%   BMI 21.97 kg/m   BP/Weight 08/21/2020 08/07/2020 7/82/9562  Systolic BP  202 542 706  Diastolic BP 64 80 62  Wt. (Lbs) 124 125 137  BMI 21.97 20.48 22.45    Physical Exam Vitals reviewed.  Constitutional:      General: She is not in acute distress.    Appearance: Normal appearance.  HENT:     Head: Normocephalic.     Right Ear: Tympanic membrane, ear canal and external ear normal.     Left Ear: Tympanic membrane, ear canal and external ear  normal.  Eyes:     Conjunctiva/sclera: Conjunctivae normal.     Pupils: Pupils are equal, round, and reactive to light.  Cardiovascular:     Rate and Rhythm: Normal rate and regular rhythm.     Pulses: Normal pulses.     Heart sounds: Normal heart sounds. No murmur heard. No gallop.   Pulmonary:     Effort: Pulmonary effort is normal. No respiratory distress.     Breath sounds: Normal breath sounds. No rales.  Abdominal:     General: Abdomen is flat. Bowel sounds are normal.     Palpations: Abdomen is soft.  Musculoskeletal:        General: Normal range of motion.  Skin:    General: Skin is warm.     Capillary Refill: Capillary refill takes less than 2 seconds.  Neurological:     General: No focal deficit present.     Mental Status: She is alert and oriented to person, place, and time.       Lab Results  Component Value Date   WBC 9.4 08/07/2020   HGB 12.4 08/07/2020   HCT 36.6 08/07/2020   PLT 375 08/07/2020   GLUCOSE 90 08/07/2020   CHOL 207 (H) 03/20/2020   TRIG 99 03/20/2020   HDL 93 03/20/2020   LDLCALC 97 03/20/2020   ALT 24 08/07/2020   AST 31 08/07/2020   NA 141 08/07/2020   K 5.1 08/07/2020   CL 101 08/07/2020   CREATININE 1.01 (H) 08/07/2020   BUN 15 08/07/2020   CO2 20 08/07/2020   TSH 2.080 03/20/2020      Assessment & Plan:   Diagnoses and all orders for this visit: Dysuria -     POCT URINALYSIS DIP (CLINITEK) -     Urine Culture Patient has dysuria and urine cultured, await results before treatment  Gastroenteritis All cultures negative, gastroenteritis resolved.  Age-related osteoporosis without current pathological fracture Patient is having stomach upset from fosamax.  We will stop and try to get Prolia approved.        Follow-up: Return if symptoms worsen or fail to improve.  An After Visit Summary was printed and given to the patient.  Reinaldo Meeker, MD [facility] [phone]

## 2020-08-23 LAB — URINE CULTURE

## 2020-08-23 NOTE — Progress Notes (Signed)
Urine culture negative ?lp

## 2020-09-12 ENCOUNTER — Other Ambulatory Visit: Payer: Self-pay | Admitting: Cardiology

## 2020-09-12 DIAGNOSIS — I10 Essential (primary) hypertension: Secondary | ICD-10-CM

## 2020-09-19 ENCOUNTER — Ambulatory Visit: Payer: Medicare Other | Admitting: Legal Medicine

## 2020-10-17 ENCOUNTER — Other Ambulatory Visit: Payer: Self-pay

## 2020-10-17 ENCOUNTER — Ambulatory Visit (INDEPENDENT_AMBULATORY_CARE_PROVIDER_SITE_OTHER): Payer: Medicare Other

## 2020-10-17 DIAGNOSIS — M810 Age-related osteoporosis without current pathological fracture: Secondary | ICD-10-CM | POA: Diagnosis not present

## 2020-10-17 MED ORDER — DENOSUMAB 60 MG/ML ~~LOC~~ SOSY
60.0000 mg | PREFILLED_SYRINGE | Freq: Once | SUBCUTANEOUS | Status: AC
  Administered 2020-10-17: 60 mg via SUBCUTANEOUS

## 2020-10-17 NOTE — Progress Notes (Signed)
Karen Briggs comes in for a new start of Prolia 60 mg.  Prolia information guide was given to  patient and her daughter.

## 2020-10-21 DIAGNOSIS — Z20822 Contact with and (suspected) exposure to covid-19: Secondary | ICD-10-CM | POA: Diagnosis not present

## 2020-11-01 ENCOUNTER — Telehealth: Payer: Self-pay | Admitting: Legal Medicine

## 2020-11-01 NOTE — Chronic Care Management (AMB) (Signed)
  Chronic Care Management   Outreach Note  11/01/2020 Name: Karen Briggs MRN: [id number] DOB: October 09, 1934  Referred by: Lillard Anes, MD Reason for referral : No chief complaint on file.   An unsuccessful telephone outreach was attempted today. The patient was referred to the pharmacist for assistance with care management and care coordination.   Follow Up Plan:   [name] Dellinger Upstream Scheduler

## 2020-11-02 ENCOUNTER — Telehealth: Payer: Self-pay | Admitting: Legal Medicine

## 2020-11-02 NOTE — Chronic Care Management (AMB) (Signed)
  Chronic Care Management   Note  11/02/2020 Name: Martha Ellerby MRN: [id number] DOB: 02-18-1935  [name] is a 86 y.o. year old female who is a primary care patient of Lillard Anes, MD. I reached out to Dorene Sorrow by phone today in response to a referral sent by Ms. Makaila Cesaro's PCP, Lillard Anes, MD.   [name] was given information about Chronic Care Management services today including:  CCM service includes personalized support from designated clinical staff supervised by her physician, including individualized plan of care and coordination with other care providers 24/7 contact phone numbers for assistance for urgent and routine care needs. Service will only be billed when office clinical staff spend 20 minutes or more in a month to coordinate care. Only one practitioner may furnish and bill the service in a calendar month. The patient may stop CCM services at any time (effective at the end of the month) by phone call to the office staff.   Randye Lobo verbally agreed to assistance and services provided by embedded care coordination/care management team today.  Follow up plan:   [name] Secretary/administrator

## 2020-11-05 DIAGNOSIS — Z23 Encounter for immunization: Secondary | ICD-10-CM | POA: Diagnosis not present

## 2020-12-12 ENCOUNTER — Telehealth: Payer: Self-pay

## 2020-12-12 NOTE — Telephone Encounter (Signed)
LM to return my call regarding refill inquiry.

## 2020-12-13 NOTE — Telephone Encounter (Signed)
Karen Briggs, Daughter of the patient was returning a call from our office

## 2020-12-14 NOTE — Telephone Encounter (Signed)
Spoke with Larene Beach (on DPR) she informs me patient does not have any allergies to any med. I advise her to call [employer] to clarify this so they can process her Lisinopril. She's informed if anything is needed on our behalf to please notify us.

## 2020-12-20 ENCOUNTER — Telehealth: Payer: Self-pay

## 2020-12-20 NOTE — Telephone Encounter (Signed)
Called patient to schedule AWV, left message for patient to call office and schedule appointment.

## 2020-12-22 NOTE — Telephone Encounter (Signed)
Pt c/o medication issue:  1. Name of Medication:   lisinopril (ZESTRIL) 10 MG tablet   2. How are you currently taking this medication (dosage and times per day)?  As prescribed   3. Are you having a reaction (difficulty breathing--STAT)?  No   4. What is your medication issue?  Patient's daughter states she is still having the same issue with [employer] not processing the Lisinopril. She would like to know what she needs to do next. Please advise.

## 2020-12-27 ENCOUNTER — Telehealth: Payer: Self-pay

## 2020-12-27 NOTE — Telephone Encounter (Signed)
Spoke with Larene Beach, she informed me that she spoke to [facility] regarding another medication not Lisinopril. She plan's to call them again and resolve Lisinopril inquiry.

## 2020-12-28 ENCOUNTER — Telehealth: Payer: Self-pay

## 2020-12-28 ENCOUNTER — Other Ambulatory Visit: Payer: Self-pay

## 2020-12-28 ENCOUNTER — Ambulatory Visit (INDEPENDENT_AMBULATORY_CARE_PROVIDER_SITE_OTHER): Payer: Medicare Other | Admitting: Legal Medicine

## 2020-12-28 ENCOUNTER — Encounter: Payer: Self-pay | Admitting: Legal Medicine

## 2020-12-28 VITALS — BP 158/70 | HR 80 | Temp 97.5°F | Resp 16 | Ht 63.0 in | Wt 119.0 lb

## 2020-12-28 DIAGNOSIS — I10 Essential (primary) hypertension: Secondary | ICD-10-CM

## 2020-12-28 DIAGNOSIS — K2100 Gastro-esophageal reflux disease with esophagitis, without bleeding: Secondary | ICD-10-CM

## 2020-12-28 DIAGNOSIS — E038 Other specified hypothyroidism: Secondary | ICD-10-CM | POA: Diagnosis not present

## 2020-12-28 DIAGNOSIS — F330 Major depressive disorder, recurrent, mild: Secondary | ICD-10-CM | POA: Diagnosis not present

## 2020-12-28 DIAGNOSIS — D509 Iron deficiency anemia, unspecified: Secondary | ICD-10-CM

## 2020-12-28 DIAGNOSIS — N1831 Chronic kidney disease, stage 3a: Secondary | ICD-10-CM

## 2020-12-28 DIAGNOSIS — E440 Moderate protein-calorie malnutrition: Secondary | ICD-10-CM

## 2020-12-28 DIAGNOSIS — E782 Mixed hyperlipidemia: Secondary | ICD-10-CM | POA: Diagnosis not present

## 2020-12-28 DIAGNOSIS — Z6821 Body mass index (BMI) 21.0-21.9, adult: Secondary | ICD-10-CM | POA: Diagnosis not present

## 2020-12-28 DIAGNOSIS — H906 Mixed conductive and sensorineural hearing loss, bilateral: Secondary | ICD-10-CM

## 2020-12-28 DIAGNOSIS — E559 Vitamin D deficiency, unspecified: Secondary | ICD-10-CM | POA: Diagnosis not present

## 2020-12-28 DIAGNOSIS — M810 Age-related osteoporosis without current pathological fracture: Secondary | ICD-10-CM

## 2020-12-28 MED ORDER — SERTRALINE HCL 25 MG PO TABS: 25.0000 mg | ORAL_TABLET | Freq: Every day | ORAL | 3 refills | Status: DC

## 2020-12-28 NOTE — Progress Notes (Signed)
Established Patient Office Visit  Subjective:  Patient ID: Karen Briggs, female    DOB: 1934/08/10  Age: 86 y.o. MRN: [id number]  CC:  Chief Complaint  Patient presents with   Hypertension   Hypothyroidism   Hyperlipidemia    HPI [name] presents for Chronic visit  Patient presents for follow up of hypertension.  Patient tolerating lisinpril and metoprolol well with side effects.  Patient was diagnosed with hypertension 2010 so has been treated for hypertension for 10 years.Patient is working on maintaining diet and exercise regimen and follows up as directed. Complication include none.   Patient presents with hyperlipidemia.  Compliance with treatment has been good; patient takes medicines as directed, maintains low cholesterol diet, follows up as directed, and maintains exercise regimen.  Patient is using simvastatin without problems.    Weight loss not eating as much. Patient has HYPOTHYROIDISM.  Diagnosed 10 years ago.  Patient has stable thyroid readings.  Patient is having tired.  Last TSH was normal.  contiue dosage of thyroid medicine.   Past Medical History:  Diagnosis Date   BMI 26.0-26.9,adult 09/16/2019   Chronic kidney disease, stage 3a (HCC) 05/31/2019   Essential hypertension 10/17/2017   Iron deficiency anemia 05/31/2019   Kidney disease    Stage 3   Mixed hyperlipidemia 10/17/2017   Osteoporosis 12/07/2019   Other specified hypothyroidism 10/17/2017   Palpitations 10/17/2017   Plantar wart 09/15/2019   Vitamin D insufficiency 05/31/2019    Past Surgical History:  Procedure Laterality Date   CHOLECYSTECTOMY  2001    Family History  Problem Relation Age of Onset   Heart attack Mother    Leukemia Father     Social History   Socioeconomic History   Marital status: Widowed    Spouse name: Not on file   Number of children: 2   Years of education: Not on file   Highest education level: Not on file  Occupational History   Occupation: Retired  Tobacco  Use   Smoking status: Never   Smokeless tobacco: Never  Substance and Sexual Activity   Alcohol use: Not Currently   Drug use: Never   Sexual activity: Not Currently  Other Topics Concern   Not on file  Social History Narrative   Not on file   Social Determinants of Health   Financial Resource Strain: Not on file  Food Insecurity: Not on file  Transportation Needs: Not on file  Physical Activity: Not on file  Stress: Not on file  Social Connections: Not on file  Intimate Partner Violence: Not on file    Outpatient Medications Prior to Visit  Medication Sig Dispense Refill   denosumab (PROLIA) 60 MG/ML SOSY injection Inject 60 mg into the skin every 6 (six) months.     acetaminophen (TYLENOL) 500 MG tablet Take 500 mg by mouth as needed for moderate pain.     aspirin EC 81 MG tablet Take 81 mg by mouth daily.     levothyroxine (SYNTHROID) 75 MCG tablet TAKE 1 TABLET EVERY DAY BEFORE BREAKFAST 90 tablet 2   lisinopril (ZESTRIL) 10 MG tablet TAKE 1 TABLET EVERY DAY 90 tablet 2   metoprolol tartrate (LOPRESSOR) 25 MG tablet TAKE 1 TABLET TWICE DAILY 180 tablet 2   Multiple Vitamin (MULTIVITAMIN) capsule Take 1 capsule by mouth daily.     omeprazole (PRILOSEC) 20 MG capsule TAKE 1 CAPSULE EVERY DAY 90 capsule 2   simvastatin (ZOCOR) 40 MG tablet Take 1 tablet (40 mg total) by  mouth daily. 90 tablet 2   No facility-administered medications prior to visit.    No Known Allergies  ROS Review of Systems  Constitutional:  Negative for activity change and appetite change.  HENT:  Negative for congestion and dental problem.   Eyes:  Negative for visual disturbance.  Respiratory:  Negative for apnea and cough.   Cardiovascular:  Negative for chest pain, palpitations and leg swelling.  Gastrointestinal:  Negative for abdominal distention and abdominal pain.  Endocrine: Negative for polyuria.  Genitourinary:  Negative for difficulty urinating and dysuria.  Musculoskeletal:  Negative  for arthralgias and back pain.  Neurological: Negative.   Psychiatric/Behavioral: Negative.       Objective:    Physical Exam Vitals reviewed.  Constitutional:      General: She is not in acute distress.    Appearance: Normal appearance.  HENT:     Head: Normocephalic.     Right Ear: Tympanic membrane normal.     Left Ear: Tympanic membrane normal.     Nose: Nose normal.     Mouth/Throat:     Mouth: Mucous membranes are moist.     Pharynx: Oropharynx is clear.  Eyes:     Extraocular Movements: Extraocular movements intact.     Conjunctiva/sclera: Conjunctivae normal.     Pupils: Pupils are equal, round, and reactive to light.  Cardiovascular:     Rate and Rhythm: Normal rate and regular rhythm.     Pulses: Normal pulses.     Heart sounds: No murmur heard.   No gallop.  Pulmonary:     Effort: Pulmonary effort is normal. No respiratory distress.     Breath sounds: Normal breath sounds. No wheezing.  Abdominal:     General: Abdomen is flat. Bowel sounds are normal. There is no distension.     Palpations: Abdomen is soft.     Tenderness: There is no abdominal tenderness.  Musculoskeletal:        General: Normal range of motion.     Cervical back: Normal range of motion.  Skin:    General: Skin is warm and dry.     Capillary Refill: Capillary refill takes less than 2 seconds.  Neurological:     General: No focal deficit present.     Mental Status: She is alert and oriented to person, place, and time. Mental status is at baseline.     Gait: Gait normal.     Deep Tendon Reflexes: Reflexes normal.  Psychiatric:        Mood and Affect: Mood normal.        Thought Content: Thought content normal.    BP (!) 158/70   Pulse 80   Temp (!) 97.5 F (36.4 C)   Resp 16   Ht $R'5\' 3"'tH$  (1.6 m)   Wt 119 lb (54 kg)   LMP  (LMP Unknown)   SpO2 98%   BMI 21.08 kg/m  Wt Readings from Last 3 Encounters:  12/28/20 119 lb (54 kg)  08/21/20 124 lb (56.2 kg)  08/07/20 125 lb (56.7 kg)    Depression screen [facility] 2/9 12/28/2020 09/16/2019 09/16/2019  Decreased Interest 3 0 1  Down, Depressed, Hopeless 3 0 1  PHQ - 2 Score 6 0 2  Altered sleeping 0 0 -  Tired, decreased energy 1 - -  Change in appetite 0 - -  Feeling bad or failure about yourself  0 - -  Trouble concentrating 0 - -  Moving slowly or fidgety/restless 0 - -  Suicidal thoughts 0 - -  PHQ-9 Score 7 0 -  Difficult doing work/chores Somewhat difficult - -      Health Maintenance Due  Topic Date Due   TETANUS/TDAP  Never done   Zoster Vaccines- Shingrix (1 of 2) Never done   PNA vac Low Risk Adult (1 of 2 - PCV13) Never done   COVID-19 Vaccine (4 - Booster for Moderna series) 06/19/2020   INFLUENZA VACCINE  11/20/2020    There are no preventive care reminders to display for this patient.  Lab Results  Component Value Date   TSH 1.050 12/28/2020   Lab Results  Component Value Date   WBC 9.6 12/28/2020   HGB 12.0 12/28/2020   HCT 36.1 12/28/2020   MCV 93 12/28/2020   PLT 338 12/28/2020   Lab Results  Component Value Date   NA 140 12/28/2020   K 5.5 (H) 12/28/2020   CO2 23 12/28/2020   GLUCOSE 88 12/28/2020   BUN 12 12/28/2020   CREATININE 0.97 12/28/2020   BILITOT 0.4 12/28/2020   ALKPHOS 49 12/28/2020   AST 27 12/28/2020   ALT 21 12/28/2020   PROT 7.5 12/28/2020   ALBUMIN 4.8 (H) 12/28/2020   CALCIUM 10.2 12/28/2020   EGFR 58 (L) 12/28/2020   Lab Results  Component Value Date   CHOL 176 12/28/2020   Lab Results  Component Value Date   HDL 81 12/28/2020   Lab Results  Component Value Date   LDLCALC 76 12/28/2020   Lab Results  Component Value Date   TRIG 109 12/28/2020   Lab Results  Component Value Date   CHOLHDL 2.2 12/28/2020   No results found for: HGBA1C    Assessment & Plan:   Problem List Items Addressed This Visit       Cardiovascular and Mediastinum   Essential hypertension - Primary   Relevant Orders   Comprehensive metabolic panel (Completed)   CBC  with Differential/Platelet (Completed) An individual hypertension care plan was established and reinforced today.  The patient's status was assessed using clinical findings on exam and labs or diagnostic tests. The patient's success at meeting treatment goals on disease specific evidence-based guidelines and found to be fair controlled. SELF MANAGEMENT: The patient and I together assessed ways to personally work towards obtaining the recommended goals. RECOMMENDATIONS: avoid decongestants found in common cold remedies, decrease consumption of alcohol, perform routine monitoring of BP with home BP cuff, exercise, reduction of dietary salt, take medicines as prescribed, try not to miss doses and quit smoking.  Regular exercise and maintaining a healthy weight is needed.  Stress reduction may help. A CLINICAL SUMMARY including written plan identify barriers to care unique to individual due to social or financial issues.  We attempt to mutually creat solutions for individual and family understanding.      Digestive   Esophageal reflux (Chronic) Plan of care was formulated today.  She is doing well.  A plan of care was formulated using patient exam, tests and other sources to optimize care using evidence based information.  Recommend no smoking, no eating after supper, avoid fatty foods, elevate Head of bed, avoid tight fitting clothing.  Continue on omeprazole.      Endocrine   Other specified hypothyroidism   Relevant Orders   TSH (Completed)   Lipid panel (Completed) Patient is known to have hypothyroisism and is on treatment with levothyroxine 110mcg.  Patient was diagnosed 10 years ago.  Other treatment includes none.  Patient is compliant  with medicines and last TSH 6 months ago.  Last TSH was normal.      Musculoskeletal and Integument   Osteoporosis   Relevant Medications   denosumab (PROLIA) 60 MG/ML SOSY injection AN INDIVIDUAL CARE PLAN for osteoporosis was established and reinforced today.   The patient's status was assessed using clinical findings on exam, labs, and other diagnostic testing. Patient's success at meeting treatment goals based on disease specific evidence-bassed guidelines and found to be in good control. RECOMMENDATIONS include maintining present medicines and treatment.      Genitourinary   Chronic kidney disease, stage 3a ([place]) (Chronic) AN INDIVIDUAL CARE PLAN CRF was established and reinforced today.  The patient's status was assessed using clinical findings on exam, labs, and other diagnostic testing. Patient's success at meeting treatment goals based on disease specific evidence-bassed guidelines and found to be in fair control. RECOMMENDATIONS include maintaining present medicines and treatment.      Other   Iron deficiency anemia (Chronic) AN INDIVIDUAL CARE PLAN chronic anemia was established and reinforced today.  The patient's status was assessed using clinical findings on exam, labs, and other diagnostic testing. Patient's success at meeting treatment goals based on disease specific evidence-bassed guidelines and found to be in good control. RECOMMENDATIONS include maintaining present medicines and treatment.     Mixed hyperlipidemia AN INDIVIDUAL CARE PLAN for hyperlipidemia/ cholesterol was established and reinforced today.  The patient's status was assessed using clinical findings on exam, lab and other diagnostic tests. The patient's disease status was assessed based on evidence-based guidelines and found to be fair controlled. MEDICATIONS were reviewed. SELF MANAGEMENT GOALS have been discussed and patient's success at attaining the goal of low cholesterol was assessed. RECOMMENDATION given include regular exercise 3 days a week and low cholesterol/low fat diet. CLINICAL SUMMARY including written plan to identify barriers unique to the patient due to social or economic  reasons was discussed.     Vitamin D insufficiency   Relevant Orders   VITAMIN  D 25 Hydroxy (Vit-D Deficiency, Fractures) (Completed) AN INDIVIDUAL CARE PLAN for vitamin D deficiency was established and reinforced today.  The patient's status was assessed using clinical findings on exam, labs, and other diagnostic testing. Patient's success at meeting treatment goals based on disease specific evidence-bassed guidelines and found to be in good control. RECOMMENDATIONS include maintaining present medicines and treatment.     BMI 21.0-21.9, adult   Malnutrition of moderate degree (HCC)   Other Visit Diagnoses     Mixed conductive and sensorineural hearing loss of both ears     Patient has poor hearing and has hearing aids    Major depressive disorder, recurrent episode, mild with melancholic features (HCC)       Relevant Medications   sertraline (ZOLOFT) 25 MG tablet Patient's depression is controlled with none now.   Anhedonia better.  PHQ 9 was performed score 7. An individual care plan was established or reinforced today.  The patient's disease status was assessed using clinical findings on exam, labs, and or other diagnostic testing to determine patient's success in meeting treatment goals based on disease specific evidence-based guidelines and found to be stable Recommendations include stay off medicines       30 minute visit with review of records   Follow-up: Return in about 3 months (around 03/29/2021) for fasting.    Reinaldo Meeker, MD

## 2020-12-28 NOTE — Chronic Care Management (AMB) (Signed)
Chronic Care Management Pharmacy Assistant   Name: Karen Briggs  MRN: [id number] DOB: November 24, 1934  Karen Briggs is an 86 y.o. year old female who presents for her initial CCM visit with the clinical pharmacist.  Reason for Encounter: Chart Prep/ IQ   Recent office visits:  12/28/20- Reinaldo Meeker, MD- seen for chronic conditions, started sertraline 25 mg daily, labs ordered, follow up 3 months  10/17/20-New  Denosumab injection for osteoporosis, follow up 6 months  08/21/20- Reinaldo Meeker, MD- seen for review of  test results, discontinued fosamax, no follow up documented  08/07/20- Reinaldo Meeker, MD- seen for chronic diarrhea, labs ordered, CT ordered, no medication changes, follow up 2 weeks  Recent consult visits:  07/11/20- Jenne Campus, MD (Cardiology)- seen for follow up of hypertension, no medication changes, follow up 6 months   Hospital visits:  None in previous 6 months  Medications: Outpatient Encounter Medications as of 12/28/2020  Medication Sig   acetaminophen (TYLENOL) 500 MG tablet Take 500 mg by mouth as needed for moderate pain.   aspirin EC 81 MG tablet Take 81 mg by mouth daily.   denosumab (PROLIA) 60 MG/ML SOSY injection Inject 60 mg into the skin every 6 (six) months.   levothyroxine (SYNTHROID) 75 MCG tablet TAKE 1 TABLET EVERY DAY BEFORE BREAKFAST   lisinopril (ZESTRIL) 10 MG tablet TAKE 1 TABLET EVERY DAY   metoprolol tartrate (LOPRESSOR) 25 MG tablet TAKE 1 TABLET TWICE DAILY   Multiple Vitamin (MULTIVITAMIN) capsule Take 1 capsule by mouth daily.   omeprazole (PRILOSEC) 20 MG capsule TAKE 1 CAPSULE EVERY DAY   sertraline (ZOLOFT) 25 MG tablet Take 1 tablet (25 mg total) by mouth daily.   simvastatin (ZOCOR) 40 MG tablet Take 1 tablet (40 mg total) by mouth daily.   No facility-administered encounter medications on file as of 12/28/2020.     No results found for: HGBA1C, MICROALBUR   BP Readings from Last 3 Encounters:  12/28/20 (!) 158/70   08/21/20 124/64  08/07/20 140/80    Current Documented Medications levothyroxine  75 MCG- 90 DS last filled 09/19/20 metoprolol tartrate  25 MG- 90 DS last filled 09/19/20 omeprazole 20 MG- 90 DS last filled 09/19/20 sertraline  25 MG- 30 DS last filled 12/28/20  Star Rating Drugs:  simvastatin  40 MG - 90 DS last filled 09/19/20 lisinopril 10 MG -90 DS last filled 09/18/20  No Fill Hx acetaminophen 500 MG tablet aspirin EC 81 MG tablet denosumab  60 MG/ML SOSY injection Multiple Vitamin capsule   Have you seen any other providers since your last visit with PCP?  Patient has not seen any other providers since her last visit with PCP  Any changes in your medications or health?  12/28/20- Reinaldo Meeker, MD started sertraline 25 mg daily  Any side effects from any medications?  Patient's daughter stated that patient complains of being tired after taking metoprolol   Do you have an symptoms or problems not managed by your medications?  Patient has no unmanaged problems   Any concerns about your health right now?  Patient's daughter stated that [name] is due for dental work and would like to know if her Prolia injections would interfere with that   Has your provider asked that you check blood pressure, blood sugar, or follow special diet at home?  Patient's daughter stated [name] does check her BP at home regularly. She does not have a specific diet but is selective in the things that she  does eat.   Do you get any type of exercise on a regular basis?  Patient's daughter stated that she walks with her daily but otherwise she only watches tv and reads.  Can you think of a goal you would like to reach for your health?  Patient's daughter stated that she would like to have her mom eat better and be able to do more activity  Do you have any problems getting your medications?  Patient's daughter verified that [name] delivers all medication promptly  Is there  anything that you would like to discuss during the appointment?  Patient's daughter would like to discuss Prolia dental work   Traniyah Christine was reminded to have all medications, supplements and any blood glucose and blood pressure readings available for review with Clarise Cruz B. Joline Salt. D, at her telephone visit on 01/04/21 at 2 pm .   Patient's daughter requested to be POC due to Ms. Brinkmeyer's hearing problems  Care Gaps: Last annual wellness visit? None noted If applicable: N/A Last eye exam / retinopathy screening? Last diabetic foot exam?  [facility] CPA, CMA

## 2020-12-29 LAB — CBC WITH DIFFERENTIAL/PLATELET
Basophils Absolute: 0.1 10*3/uL (ref 0.0–0.2)
Basos: 1 %
EOS (ABSOLUTE): 0.3 10*3/uL (ref 0.0–0.4)
Eos: 3 %
Hematocrit: 36.1 % (ref 34.0–46.6)
Hemoglobin: 12 g/dL (ref 11.1–15.9)
Immature Grans (Abs): 0 10*3/uL (ref 0.0–0.1)
Immature Granulocytes: 0 %
Lymphocytes Absolute: 2.5 10*3/uL (ref 0.7–3.1)
Lymphs: 26 %
MCH: 30.8 pg (ref 26.6–33.0)
MCHC: 33.2 g/dL (ref 31.5–35.7)
MCV: 93 fL (ref 79–97)
Monocytes Absolute: 0.6 10*3/uL (ref 0.1–0.9)
Monocytes: 7 %
Neutrophils Absolute: 6 10*3/uL (ref 1.4–7.0)
Neutrophils: 63 %
Platelets: 338 10*3/uL (ref 150–450)
RBC: 3.89 x10E6/uL (ref 3.77–5.28)
RDW: 13.2 % (ref 11.7–15.4)
WBC: 9.6 10*3/uL (ref 3.4–10.8)

## 2020-12-29 LAB — COMPREHENSIVE METABOLIC PANEL
ALT: 21 IU/L (ref 0–32)
AST: 27 IU/L (ref 0–40)
Albumin/Globulin Ratio: 1.8 (ref 1.2–2.2)
Albumin: 4.8 g/dL — ABNORMAL HIGH (ref 3.6–4.6)
Alkaline Phosphatase: 49 IU/L (ref 44–121)
BUN/Creatinine Ratio: 12 (ref 12–28)
BUN: 12 mg/dL (ref 8–27)
Bilirubin Total: 0.4 mg/dL (ref 0.0–1.2)
CO2: 23 mmol/L (ref 20–29)
Calcium: 10.2 mg/dL (ref 8.7–10.3)
Chloride: 101 mmol/L (ref 96–106)
Creatinine, Ser: 0.97 mg/dL (ref 0.57–1.00)
Globulin, Total: 2.7 g/dL (ref 1.5–4.5)
Glucose: 88 mg/dL (ref 65–99)
Potassium: 5.5 mmol/L — ABNORMAL HIGH (ref 3.5–5.2)
Sodium: 140 mmol/L (ref 134–144)
Total Protein: 7.5 g/dL (ref 6.0–8.5)
eGFR: 58 mL/min/{1.73_m2} — ABNORMAL LOW (ref >59)

## 2020-12-29 LAB — LIPID PANEL
Chol/HDL Ratio: 2.2 ratio (ref 0.0–4.4)
Cholesterol, Total: 176 mg/dL (ref 100–199)
HDL: 81 mg/dL (ref >39)
LDL Chol Calc (NIH): 76 mg/dL (ref 0–99)
Triglycerides: 109 mg/dL (ref 0–149)
VLDL Cholesterol Cal: 19 mg/dL (ref 5–40)

## 2020-12-29 LAB — TSH: TSH: 1.05 u[IU]/mL (ref 0.450–4.500)

## 2020-12-29 LAB — VITAMIN D 25 HYDROXY (VIT D DEFICIENCY, FRACTURES): Vit D, 25-Hydroxy: 24.2 ng/mL — ABNORMAL LOW (ref 30.0–100.0)

## 2020-12-29 LAB — CARDIOVASCULAR RISK ASSESSMENT

## 2020-12-29 NOTE — Progress Notes (Signed)
Tsh 1,050 normal, kidney tests stage 3a mild, , potassium 5.5 high- recheck one week, liver tests normAL, CHOLESTEROL NORMAL, VITAMIN D 24 LOW- TAKE 2000IU PILL A DAY, CBC normal,  lp

## 2021-01-04 ENCOUNTER — Other Ambulatory Visit: Payer: Self-pay

## 2021-01-04 ENCOUNTER — Ambulatory Visit (INDEPENDENT_AMBULATORY_CARE_PROVIDER_SITE_OTHER): Payer: Medicare Other

## 2021-01-04 DIAGNOSIS — E038 Other specified hypothyroidism: Secondary | ICD-10-CM

## 2021-01-04 DIAGNOSIS — E039 Hypothyroidism, unspecified: Secondary | ICD-10-CM

## 2021-01-04 DIAGNOSIS — E782 Mixed hyperlipidemia: Secondary | ICD-10-CM

## 2021-01-04 DIAGNOSIS — I10 Essential (primary) hypertension: Secondary | ICD-10-CM

## 2021-01-04 NOTE — Progress Notes (Signed)
Chronic Care Management Pharmacy Note  01/04/2021 Name:  Karen Briggs MRN:  [id number] DOB:  01/31/1935  Summary: -Pleasant 86 year old female presents with her daughter Karen Briggs) for initial CCM visit. Patient grew up in [place] and moved in with her daughter and SIL 3 years ago (Daughter has been here for 25 years)  Recommendations/Changes made from today's visit: -Rush Landmark called on 12/20/20 for AWV but wasn't able to get in touch, I'll ask her to reach out again  Plan: -Noted start date of prolia -Explained to patient how it takes a 6 weeks for Sertaline to kick in, too soon to repeat PHQ9 today, will do so at future visit -Non-compliant on Lisinopril and statin per fill Hx but they assure me of 100% compliance   Subjective: Karen Briggs is an 86 y.o. year old female who is a primary patient of Karen Briggs, Zeb Comfort, MD.  The CCM team was consulted for assistance with disease management and care coordination needs.    Engaged with patient by telephone for initial visit in response to provider referral for pharmacy case management and/or care coordination services.   Consent to Services:  The patient was given the following information about Chronic Care Management services today, agreed to services, and gave verbal consent: 1. CCM service includes personalized support from designated clinical staff supervised by the primary care provider, including individualized plan of care and coordination with other care providers 2. 24/7 contact phone numbers for assistance for urgent and routine care needs. 3. Service will only be billed when office clinical staff spend 20 minutes or more in a month to coordinate care. 4. Only one practitioner may furnish and bill the service in a calendar month. 5.The patient may stop CCM services at any time (effective at the end of the month) by phone call to the office staff. 6. The patient will be responsible for cost sharing (co-pay) of up to 20% of  the service fee (after annual deductible is met). Patient agreed to services and consent obtained.  Patient Care Team: Lillard Anes, MD as PCP - General (Family Medicine) Burnice Logan, [facility] (Inactive) as Pharmacist (Pharmacist)  Recent office visits:  12/28/20- Reinaldo Meeker, MD- seen for chronic conditions, started sertraline 25 mg daily, labs ordered, follow up 3 months  10/17/20-New  Denosumab injection for osteoporosis, follow up 6 months  08/21/20- Reinaldo Meeker, MD- seen for review of  test results, discontinued fosamax, no follow up documented  08/07/20- Reinaldo Meeker, MD- seen for chronic diarrhea, labs ordered, CT ordered, no medication changes, follow up 2 weeks   Recent consult visits:  07/11/20- Jenne Campus, MD (Cardiology)- seen for follow up of hypertension, no medication changes, follow up 6 months    Hospital visits:  None in previous 6 months   Objective:  Lab Results  Component Value Date   CREATININE 0.97 12/28/2020   BUN 12 12/28/2020   GFRNONAA 50 (L) 03/20/2020   GFRAA 58 (L) 03/20/2020   NA 140 12/28/2020   K 5.5 (H) 12/28/2020   CALCIUM 10.2 12/28/2020   CO2 23 12/28/2020   GLUCOSE 88 12/28/2020    No results found for: HGBA1C, FRUCTOSAMINE, GFR, MICROALBUR  Last diabetic Eye exam: No results found for: HMDIABEYEEXA  Last diabetic Foot exam: No results found for: HMDIABFOOTEX   Lab Results  Component Value Date   CHOL 176 12/28/2020   HDL 81 12/28/2020   LDLCALC 76 12/28/2020   TRIG 109 12/28/2020   CHOLHDL 2.2 12/28/2020  Hepatic Function Latest Ref Rng & Units 12/28/2020 08/07/2020 03/20/2020  Total Protein 6.0 - 8.5 g/dL 7.5 7.4 8.0  Albumin 3.6 - 4.6 g/dL 4.8(H) 5.0(H) 5.1(H)  AST 0 - 40 IU/L $Remov'27 31 29  'fZnRSO$ ALT 0 - 32 IU/L $Remov'21 24 20  'SAooNR$ Alk Phosphatase 44 - 121 IU/L 49 49 65  Total Bilirubin 0.0 - 1.2 mg/dL 0.4 0.4 0.5    Lab Results  Component Value Date/Time   TSH 1.050 12/28/2020 09:52 AM   TSH 2.080 03/20/2020 09:46  AM    CBC Latest Ref Rng & Units 12/28/2020 08/07/2020 03/20/2020  WBC 3.4 - 10.8 x10E3/uL 9.6 9.4 9.3  Hemoglobin 11.1 - 15.9 g/dL 12.0 12.4 12.7  Hematocrit 34.0 - 46.6 % 36.1 36.6 38.3  Platelets 150 - 450 x10E3/uL 338 375 361    Lab Results  Component Value Date/Time   VD25OH 24.2 (L) 12/28/2020 09:52 AM    Clinical ASCVD: No  The ASCVD Risk score (Arnett DK, et al., 2019) failed to calculate for the following reasons:   The 2019 ASCVD risk score is only valid for ages 59 to 63    Depression screen PHQ 2/9 12/28/2020 09/16/2019 09/16/2019  Decreased Interest 3 0 1  Down, Depressed, Hopeless 3 0 1  PHQ - 2 Score 6 0 2  Altered sleeping 0 0 -  Tired, decreased energy 1 - -  Change in appetite 0 - -  Feeling bad or failure about yourself  0 - -  Trouble concentrating 0 - -  Moving slowly or fidgety/restless 0 - -  Suicidal thoughts 0 - -  PHQ-9 Score 7 0 -  Difficult doing work/chores Somewhat difficult - -     Other: (CHADS2VASc if Afib, MMRC or CAT for COPD, ACT, DEXA)  Social History   Tobacco Use  Smoking Status Never  Smokeless Tobacco Never   BP Readings from Last 3 Encounters:  12/28/20 (!) 158/70  08/21/20 124/64  08/07/20 140/80   Pulse Readings from Last 3 Encounters:  12/28/20 80  08/21/20 75  08/07/20 81   Wt Readings from Last 3 Encounters:  12/28/20 119 lb (54 kg)  08/21/20 124 lb (56.2 kg)  08/07/20 125 lb (56.7 kg)   BMI Readings from Last 3 Encounters:  12/28/20 21.08 kg/m  08/21/20 21.97 kg/m  08/07/20 20.48 kg/m    Assessment/Interventions: Review of patient past medical history, allergies, medications, health status, including review of consultants reports, laboratory and other test data, was performed as part of comprehensive evaluation and provision of chronic care management services.   SDOH:  (Social Determinants of Health) assessments and interventions performed: Yes SDOH Interventions    Flowsheet Row Most Recent Value  SDOH  Interventions   Financial Strain Interventions Intervention Not Indicated  Transportation Interventions Intervention Not Indicated  [Lives with daughter]      SDOH Screenings   Alcohol Screen: Not on file  Depression (PHQ2-9): Medium Risk   PHQ-2 Score: 7  Financial Resource Strain: Low Risk    Difficulty of Paying Living Expenses: Not hard at all  Food Insecurity: Not on file  Housing: Not on file  Physical Activity: Not on file  Social Connections: Not on file  Stress: Not on file  Tobacco Use: Low Risk    Smoking Tobacco Use: Never   Smokeless Tobacco Use: Never  Transportation Needs: No Transportation Needs   Lack of Transportation (Medical): No   Lack of Transportation (Non-Medical): No    CCM Care Plan  No  Known Allergies  Medications Reviewed Today     Reviewed by Lane Hacker, [facility] (Pharmacist) on 01/04/21 at 1446  Med List Status: <None>   Medication Order Taking? Sig Documenting Provider Last Dose Status Informant  acetaminophen (TYLENOL) 500 MG tablet [id number]  Take 500 mg by mouth as needed for moderate pain. [provider]  Active   aspirin EC 81 MG tablet [id number] Yes Take 81 mg by mouth daily. [provider] Taking Active   denosumab (PROLIA) 60 MG/ML SOSY injection [id number] Yes Inject 60 mg into the skin every 6 (six) months. [provider] Taking Active   levothyroxine (SYNTHROID) 75 MCG tablet [id number] Yes TAKE 1 TABLET EVERY DAY BEFORE BREAKFAST Lillard Anes, MD Taking Active   lisinopril (ZESTRIL) 10 MG tablet [id number] Yes TAKE 1 TABLET EVERY DAY Park Liter, MD Taking Active   metoprolol tartrate (LOPRESSOR) 25 MG tablet [id number] Yes TAKE 1 TABLET TWICE DAILY Lillard Anes, MD Taking Active   Multiple Vitamin (MULTIVITAMIN) capsule [id number]  Take 1 capsule by mouth daily. [provider]  Active   omeprazole (PRILOSEC) 20 MG capsule [id number] Yes TAKE 1 CAPSULE EVERY DAY Lillard Anes, MD Taking Active   sertraline (ZOLOFT) 25 MG tablet [id number] Yes Take 1 tablet (25 mg total) by mouth daily. Lillard Anes, MD Taking Active   simvastatin (ZOCOR) 40 MG tablet [id number] Yes Take 1 tablet (40 mg total) by mouth daily. Lillard Anes, MD Taking Active             Patient Active Problem List   Diagnosis Date Noted   BMI 21.0-21.9, adult 12/28/2020   Malnutrition of moderate degree ([place]) 12/28/2020   Gastroenteritis 08/21/2020   Dysuria 08/21/2020   Abdominal pain 08/07/2020   Kidney disease    Osteoporosis 12/07/2019   Plantar wart 09/15/2019   Esophageal reflux 05/31/2019   Chronic kidney disease, stage 3a ([place]) 05/31/2019   Vitamin D insufficiency 05/31/2019   Iron deficiency anemia 05/31/2019   Palpitations 10/17/2017   Essential hypertension 10/17/2017   Mixed hyperlipidemia 10/17/2017   Other specified hypothyroidism 10/17/2017    Immunization History  Administered Date(s) Administered   Fluad Quad(high Dose 65+) 02/01/2020   Influenza, High Dose Seasonal PF 02/11/2018   Moderna Sars-Covid-2 Vaccination 05/14/2019, 06/09/2019, 02/20/2020    Conditions to be addressed/monitored:  Hypertension, Hyperlipidemia, Hypothyroidism, and Depression  Care Plan : [place]  Updates made by Lane Hacker, [place] since 01/04/2021 12:00 AM     Problem: HTN, Lipids, Mental Health   Priority: High  Onset Date: 01/04/2021     Problem: Disease State Management   Priority: High  Onset Date: 01/04/2021  Note:   Current Barriers:  Does not maintain contact with provider office Does not contact provider office for questions/concerns  Pharmacist Clinical Goal(s):  Patient will contact provider office for questions/concerns as evidenced notation of same in electronic health record through collaboration with PharmD and provider.   Interventions: 1:1 collaboration with Lillard Anes, MD regarding development  and update of comprehensive plan of care as evidenced by provider attestation and co-signature Inter-disciplinary care team collaboration (see longitudinal plan of care) Comprehensive medication review performed; medication list updated in electronic medical record  Hypertension (BP goal <140/90) -Dx in 2010 BP Readings from Last 3 Encounters:  12/28/20 (!) 158/70  08/21/20 124/64  08/07/20 140/80  -Controlled -Current treatment: Metoprolol 25mg  Lisinopril 10mg  -Medications previously tried: N/A  -Current home readings:   *  September 2022: 119/66, nothing above 128/66 in past month. 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 Per patient, she had very bad anxiety that can exacerbate her BP -Current dietary habits: "Tries to eat healthy" -Current exercise habits: N/A -Denies hypotensive/hypertensive symptoms -Educated on Symptoms of hypotension and importance of maintaining adequate hydration; -Counseled to monitor BP at home [facility], document, and provide log at future appointments September 2022: Extensive time spent on counseling patient on blood pressure goal and impact of each antihypertensive medication on their blood pressure and risk reduction for CV disease.  Used analogies to explain the need for multiple antihypertensive medications to achieve BP goals and that it is often a silent disease with no symptoms.  -Recommended to continue current medication  Hyperlipidemia: (LDL goal < 100) Extensive time spent on counseling patient on blood pressure goal and impact of each antihypertensive medication on their blood pressure and risk reduction for CV disease.  Used analogies to explain the need for multiple antihypertensive medications to achieve BP goals and that it is often a silent disease with no symptoms.  Lab Results  Component Value Date   CHOL 176 12/28/2020   CHOL 207 (H) 03/20/2020   CHOL 164 09/16/2019   Lab Results  Component Value Date   HDL 81  12/28/2020   HDL 93 03/20/2020   HDL 77 09/16/2019   Lab Results  Component Value Date   LDLCALC 76 12/28/2020   LDLCALC 97 03/20/2020   LDLCALC 66 09/16/2019   Lab Results  Component Value Date   TRIG 109 12/28/2020   TRIG 99 03/20/2020   TRIG 122 09/16/2019   Lab Results  Component Value Date   CHOLHDL 2.2 12/28/2020   CHOLHDL 2.2 03/20/2020   CHOLHDL 2.1 09/16/2019  No results found for: LDLDIRECT  -Controlled -Current treatment: Simvastatin 40mg  -Medications previously tried: N/a  -Current dietary patterns: "Tries to eat healthy" -Current exercise habits: N/A -Educated on Cholesterol goals;  September 2022: Over 15 minutes spent counseling patient on role of hyperlipidemia on heart disease and the impact of each lipid subclass with emphasis on LDL and heart disease risk.  Explained role of statins in prevention of CV disease complications and actual risk for adverse effects with statin treatment.  Counseled patient on genetic component placing them at risk for hyperlipidemia. -Recommended to continue current medication  Depression/Anxiety  -Unknown controlled -Current treatment: Sertaline 25mg  (Started September 2022) -Medications previously tried/failed: N/A -PHQ9:  Depression screen [facility] 2/9 12/28/2020 09/16/2019 09/16/2019  Decreased Interest 3 0 1  Down, Depressed, Hopeless 3 0 1  PHQ - 2 Score 6 0 2  Altered sleeping 0 0 -  Tired, decreased energy 1 - -  Change in appetite 0 - -  Feeling bad or failure about yourself  0 - -  Trouble concentrating 0 - -  Moving slowly or fidgety/restless 0 - -  Suicidal thoughts 0 - -  PHQ-9 Score 7 0 -  Difficult doing work/chores Somewhat difficult - -  -GAD7:  GAD 7 : Generalized Anxiety Score 12/28/2020  Nervous, Anxious, on Edge 3  Control/stop worrying 3  Worry too much - different things 3  Trouble relaxing 2  Restless 0  Easily annoyed or irritable 0  Afraid - awful might happen 3  Total GAD 7 Score 14  Anxiety  Difficulty Somewhat difficult  -Educated on Benefits of medication for symptom control September 2022: Too soon to re-evaluate with PHQ9, counseled on how it takes about 6  weeks to kick in  Osteoporosis / Osteopenia  -Uncontrolled -Last DEXA Scan: 12/05/20 Femur Neck Left: -2.7 Left Forearm -2.6 -Patient is a candidate for pharmacologic treatment due to T-Score < -2.5 in femoral neck -Current treatment  Prolia (Started August 2022) -Medications previously tried: N/a  -Recommend [phone] units of vitamin D daily. Recommend 1200 mg of calcium daily from dietary and supplemental sources. -Recommended to continue current medication   Thyroid Lab Results  Component Value Date   TSH 1.050 12/28/2020  -Controlled -Current treatment  Levothyroxine 48mcg -Medications previously tried: N/A  -Recommended to continue current medication   Patient Goals/Self-Care Activities Patient will:  - take medications as prescribed  Follow Up Plan: The patient has been provided with contact information for the care management team and has been advised to call with any health related questions or concerns.        Medication Assistance: None required.  Patient affirms current coverage meets needs.  Compliance/Adherence/Medication fill history: Care Gaps: Last annual wellness visit? None noted If applicable: N/A Last eye exam / retinopathy screening? Last diabetic foot exam?  Star Rating Drugs:  simvastatin  40 MG - 90 DS last filled 09/19/20 lisinopril 10 MG -90 DS last filled 09/18/20 Natalia Leatherwood of 01/04/21, patient states they are compliant and got a refill of it "Last week"   Patient's preferred pharmacy is:  [id number] - [place], [address] [place] [address] Phone: [phone] Fax: [phone]  [facility] - [place], [place] [address] Phone: [phone] Fax: [phone]  Uses pill box? Yes Pt endorses 100% compliance  Care Plan and Follow Up Patient Decision:  Patient agrees to Care Plan and Follow-up.  Plan: The patient has been provided with contact information for the care management team and has been advised to call with any health related questions or concerns.

## 2021-01-04 NOTE — Patient Instructions (Signed)
Visit Information   Goals Addressed             This Visit's Progress    Manage My Medicine       Timeframe:  Long-Range Goal Priority:  High Start Date:                             Expected End Date:                       Follow Up Date 01/04/22   - call for medicine refill 2 or 3 days before it runs out - keep a list of all the medicines I take; vitamins and herbals too - use a pillbox to sort medicine    Why is this important?   These steps will help you keep on track with your medicines.   Notes:      Track and Manage My Blood Pressure-Hypertension       Timeframe:  Long-Range Goal Priority:  High Start Date:                             Expected End Date:                       Follow Up Date 01/04/22   - choose a place to take my blood pressure (home, clinic or office, retail store) - write blood pressure results in a log or diary    Why is this important?   You won't feel high blood pressure, but it can still hurt your blood vessels.  High blood pressure can cause heart or kidney problems. It can also cause a stroke.  Making lifestyle changes like losing a little weight or eating less salt will help.  Checking your blood pressure at home and at different times of the day can help to control blood pressure.  If the doctor prescribes medicine remember to take it the way the doctor ordered.  Call the office if you cannot afford the medicine or if there are questions about it.     Notes:        Patient Care Plan: CCM Pharmacy Care Plan     Problem Identified: HTN, Lipids, Mental Health   Priority: High  Onset Date: 01/04/2021     Problem Identified: Disease State Management   Priority: High  Onset Date: 01/04/2021  Note:   Current Barriers:  Does not maintain contact with provider office Does not contact provider office for questions/concerns  Pharmacist Clinical Goal(s):  Patient will contact provider office for questions/concerns as evidenced notation  of same in electronic health record through collaboration with PharmD and provider.   Interventions: 1:1 collaboration with Lillard Anes, MD regarding development and update of comprehensive plan of care as evidenced by provider attestation and co-signature Inter-disciplinary care team collaboration (see longitudinal plan of care) Comprehensive medication review performed; medication list updated in electronic medical record  Hypertension (BP goal <140/90) -Dx in 2010 BP Readings from Last 3 Encounters:  12/28/20 (!) 158/70  08/21/20 124/64  08/07/20 140/80  -Controlled -Current treatment: Metoprolol '25mg'$  Lisinopril '10mg'$  -Medications previously tried: N/A  -Current home readings:   *September 2022: 119/66, nothing above 128/66 in past month. 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 Per patient, she had very bad anxiety that can exacerbate her BP -Current dietary habits: "  Tries to eat healthy" -Current exercise habits: N/A -Denies hypotensive/hypertensive symptoms -Educated on Symptoms of hypotension and importance of maintaining adequate hydration; -Counseled to monitor BP at home [facility], document, and provide log at future appointments September 2022: Extensive time spent on counseling patient on blood pressure goal and impact of each antihypertensive medication on their blood pressure and risk reduction for CV disease.  Used analogies to explain the need for multiple antihypertensive medications to achieve BP goals and that it is often a silent disease with no symptoms.  -Recommended to continue current medication  Hyperlipidemia: (LDL goal < 100) Extensive time spent on counseling patient on blood pressure goal and impact of each antihypertensive medication on their blood pressure and risk reduction for CV disease.  Used analogies to explain the need for multiple antihypertensive medications to achieve BP goals and that it is often a silent disease with no  symptoms.  Lab Results  Component Value Date   CHOL 176 12/28/2020   CHOL 207 (H) 03/20/2020   CHOL 164 09/16/2019   Lab Results  Component Value Date   HDL 81 12/28/2020   HDL 93 03/20/2020   HDL 77 09/16/2019   Lab Results  Component Value Date   LDLCALC 76 12/28/2020   LDLCALC 97 03/20/2020   LDLCALC 66 09/16/2019   Lab Results  Component Value Date   TRIG 109 12/28/2020   TRIG 99 03/20/2020   TRIG 122 09/16/2019   Lab Results  Component Value Date   CHOLHDL 2.2 12/28/2020   CHOLHDL 2.2 03/20/2020   CHOLHDL 2.1 09/16/2019  No results found for: LDLDIRECT  -Controlled -Current treatment: Simvastatin '40mg'$  -Medications previously tried: N/a  -Current dietary patterns: "Tries to eat healthy" -Current exercise habits: N/A -Educated on Cholesterol goals;  September 2022: Over 15 minutes spent counseling patient on role of hyperlipidemia on heart disease and the impact of each lipid subclass with emphasis on LDL and heart disease risk.  Explained role of statins in prevention of CV disease complications and actual risk for adverse effects with statin treatment.  Counseled patient on genetic component placing them at risk for hyperlipidemia. -Recommended to continue current medication  Depression/Anxiety  -Unknown controlled -Current treatment: Sertaline '25mg'$  (Started September 2022) -Medications previously tried/failed: N/A -PHQ9:  Depression screen [facility] 2/9 12/28/2020 09/16/2019 09/16/2019  Decreased Interest 3 0 1  Down, Depressed, Hopeless 3 0 1  PHQ - 2 Score 6 0 2  Altered sleeping 0 0 -  Tired, decreased energy 1 - -  Change in appetite 0 - -  Feeling bad or failure about yourself  0 - -  Trouble concentrating 0 - -  Moving slowly or fidgety/restless 0 - -  Suicidal thoughts 0 - -  PHQ-9 Score 7 0 -  Difficult doing work/chores Somewhat difficult - -  -GAD7:  GAD 7 : Generalized Anxiety Score 12/28/2020  Nervous, Anxious, on Edge 3  Control/stop worrying 3   Worry too much - different things 3  Trouble relaxing 2  Restless 0  Easily annoyed or irritable 0  Afraid - awful might happen 3  Total GAD 7 Score 14  Anxiety Difficulty Somewhat difficult  -Educated on Benefits of medication for symptom control September 2022: Too soon to re-evaluate with PHQ9, counseled on how it takes about 6 weeks to kick in  Osteoporosis / Osteopenia  -Uncontrolled -Last DEXA Scan: 12/05/20 Femur Neck Left: -2.7 Left Forearm -2.6 -Patient is a candidate for pharmacologic treatment due to T-Score < -2.5 in femoral  neck -Current treatment  Prolia (Started August 2022) -Medications previously tried: N/a  -Recommend [phone] units of vitamin D daily. Recommend 1200 mg of calcium daily from dietary and supplemental sources. -Recommended to continue current medication   Thyroid Lab Results  Component Value Date   TSH 1.050 12/28/2020  -Controlled -Current treatment  Levothyroxine 89mg -Medications previously tried: N/A  -Recommended to continue current medication   Patient Goals/Self-Care Activities Patient will:  - take medications as prescribed  Follow Up Plan: The patient has been provided with contact information for the care management team and has been advised to call with any health related questions or concerns.       Ms. BCloudenwas given information about Chronic Care Management services today including:  CCM service includes personalized support from designated clinical staff supervised by her physician, including individualized plan of care and coordination with other care providers 24/7 contact phone numbers for assistance for urgent and routine care needs. Standard insurance, coinsurance, copays and deductibles apply for chronic care management only during months in which we provide at least 20 minutes of these services. Most insurances cover these services at 100%, however patients may be responsible for any copay, coinsurance and/or  deductible if applicable. This service may help you avoid the need for more expensive face-to-face services. Only one practitioner may furnish and bill the service in a calendar month. The patient may stop CCM services at any time (effective at the end of the month) by phone call to the office staff.  Patient agreed to services and verbal consent obtained.   The patient verbalized understanding of instructions, educational materials, and care plan provided today and declined offer to receive copy of patient instructions, educational materials, and care plan.  The pharmacy team will reach out to the patient again over the next 90 days.   NLane Hacker [facility]

## 2021-01-10 ENCOUNTER — Ambulatory Visit (INDEPENDENT_AMBULATORY_CARE_PROVIDER_SITE_OTHER): Payer: Medicare Other

## 2021-01-10 VITALS — Ht 63.0 in | Wt 119.0 lb

## 2021-01-10 DIAGNOSIS — Z0000 Encounter for general adult medical examination without abnormal findings: Secondary | ICD-10-CM | POA: Diagnosis not present

## 2021-01-10 NOTE — Progress Notes (Signed)
Subjective:   Karen Briggs is a 86 y.o. female who presents for Medicare Annual (Subsequent) preventive examination.  I connected with  Karen Briggs on 01/10/21 by a Audio enabled telemedicine application and verified that I am speaking with the correct person using two identifiers.   I discussed the limitations of evaluation and management by telemedicine. The patient expressed understanding and agreed to proceed.   Location of Patient: Home  Location of Provider: Home  Person Review of Systems  Defer to PCP   Patient will like Flu shot and Hep C screening at next visit.  Patient will like discuss mammogram with provider.    Objective:    There were no vitals filed for this visit. There is no height or weight on file to calculate BMI.  No flowsheet data found.  Current Medications (verified) Outpatient Encounter Medications as of 01/10/2021  Medication Sig   acetaminophen (TYLENOL) 500 MG tablet Take 500 mg by mouth as needed for moderate pain.   aspirin EC 81 MG tablet Take 81 mg by mouth daily.   denosumab (PROLIA) 60 MG/ML SOSY injection Inject 60 mg into the skin every 6 (six) months.   levothyroxine (SYNTHROID) 75 MCG tablet TAKE 1 TABLET EVERY DAY BEFORE BREAKFAST   lisinopril (ZESTRIL) 10 MG tablet TAKE 1 TABLET EVERY DAY   metoprolol tartrate (LOPRESSOR) 25 MG tablet TAKE 1 TABLET TWICE DAILY   Multiple Vitamin (MULTIVITAMIN) capsule Take 1 capsule by mouth daily.   omeprazole (PRILOSEC) 20 MG capsule TAKE 1 CAPSULE EVERY DAY   sertraline (ZOLOFT) 25 MG tablet Take 1 tablet (25 mg total) by mouth daily.   simvastatin (ZOCOR) 40 MG tablet Take 1 tablet (40 mg total) by mouth daily.   No facility-administered encounter medications on file as of 01/10/2021.    Allergies (verified) Patient has no known allergies.   History: Past Medical History:  Diagnosis Date   BMI 26.0-26.9,adult 09/16/2019   Chronic kidney disease, stage 3a ([place]) 05/31/2019   Essential  hypertension 10/17/2017   Iron deficiency anemia 05/31/2019   Kidney disease    Stage 3   Mixed hyperlipidemia 10/17/2017   Osteoporosis 12/07/2019   Other specified hypothyroidism 10/17/2017   Palpitations 10/17/2017   Plantar wart 09/15/2019   Vitamin D insufficiency 05/31/2019   Past Surgical History:  Procedure Laterality Date   CHOLECYSTECTOMY  2001   Family History  Problem Relation Age of Onset   Heart attack Mother    Leukemia Father    Social History   Socioeconomic History   Marital status: Widowed    Spouse name: Not on file   Number of children: 2   Years of education: Not on file   Highest education level: Not on file  Occupational History   Occupation: Retired  Tobacco Use   Smoking status: Never   Smokeless tobacco: Never  Substance and Sexual Activity   Alcohol use: Not Currently   Drug use: Never   Sexual activity: Not Currently  Other Topics Concern   Not on file  Social History Narrative   Not on file   Social Determinants of Health   Financial Resource Strain: Low Risk    Difficulty of Paying Living Expenses: Not hard at all  Food Insecurity: Not on file  Transportation Needs: No Transportation Needs   Lack of Transportation (Medical): No   Lack of Transportation (Non-Medical): No  Physical Activity: Not on file  Stress: Not on file  Social Connections: Not on file  Tobacco Counseling Counseling given: Not Answered   Clinical Intake:                 Diabetic?NO         Activities of Daily Living In your present state of health, do you have any difficulty performing the following activities: 12/28/2020  Hearing? N  Vision? N  Difficulty concentrating or making decisions? N  Walking or climbing stairs? N  Dressing or bathing? N  Doing errands, shopping? Y  Some recent data might be hidden    Patient Care Team: Lillard Anes, MD as PCP - General (Family Medicine) Burnice Logan, [facility] (Inactive) as Pharmacist  (Pharmacist)  Indicate any recent Medical Services you may have received from other than [facility] providers in the past year (date may be approximate).     Assessment:   This is a routine wellness examination for Karen Briggs.  Hearing/Vision screen No results found.  Dietary issues and exercise activities discussed:     Goals Addressed   None   Depression Screen PHQ 2/9 Scores 12/28/2020 09/16/2019 09/16/2019  PHQ - 2 Score 6 0 2  PHQ- 9 Score 7 0 -    Fall Risk Fall Risk  12/28/2020 09/16/2019  Falls in the past year? 0 1  Number falls in past yr: 0 0  Injury with Fall? 0 0  Risk for fall due to : History of fall(s) -  Follow up Education provided Falls evaluation completed    [place]:  Any stairs in or around the home? Yes  If so, are there any without handrails? No  Home free of loose throw rugs in walkways, pet beds, electrical cords, etc? Yes  Adequate lighting in your home to reduce risk of falls? Yes   ASSISTIVE DEVICES UTILIZED TO PREVENT FALLS:  Life alert? No  Use of a cane, walker or w/c? No  Grab bars in the bathroom? No  Shower chair or bench in shower? Yes  Elevated toilet seat or a handicapped toilet? No   TIMED UP AND GO:  Was the test performed?  N/A .  Length of time to ambulate 10 feet: N/A sec.     Cognitive Function:        Immunizations Immunization History  Administered Date(s) Administered   Fluad Quad(high Dose 65+) 02/01/2020   Influenza, High Dose Seasonal PF 02/11/2018   Moderna Sars-Covid-2 Vaccination 05/14/2019, 06/09/2019, 02/20/2020    TDAP status: Due, Education has been provided regarding the importance of this vaccine. Advised may receive this vaccine at local pharmacy or Health Dept. Aware to provide a copy of the vaccination record if obtained from local pharmacy or Health Dept. Verbalized acceptance and understanding.  Flu Vaccine status: Due, Education has been provided regarding the  importance of this vaccine. Advised may receive this vaccine at local pharmacy or Health Dept. Aware to provide a copy of the vaccination record if obtained from local pharmacy or Health Dept. Verbalized acceptance and understanding.  Pneumococcal vaccine status: Up to date  Covid-19 vaccine status: Completed vaccines  Qualifies for Shingles Vaccine? Yes   Zostavax completed No   Shingrix Completed?: No.    Education has been provided regarding the importance of this vaccine. Patient has been advised to call insurance company to determine out of pocket expense if they have not yet received this vaccine. Advised may also receive vaccine at local pharmacy or Health Dept. Verbalized acceptance and understanding.  Screening Tests Health Maintenance  Topic Date Due   TETANUS/TDAP  Never done   Zoster Vaccines- Shingrix (1 of 2) Never done   COVID-19 Vaccine (4 - Booster for Moderna series) 06/19/2020   INFLUENZA VACCINE  11/20/2020   DEXA SCAN  Completed   HPV VACCINES  Aged Out    Health Maintenance  Health Maintenance Due  Topic Date Due   TETANUS/TDAP  Never done   Zoster Vaccines- Shingrix (1 of 2) Never done   COVID-19 Vaccine (4 - Booster for Moderna series) 06/19/2020   INFLUENZA VACCINE  11/20/2020    Colorectal cancer screening: No longer required.   Mammogram status: No longer required due to Patient will discuss wit Dr. Henrene Pastor at her next visit.  Bone Density status: Completed 0816/2021. Results reflect: Bone density results: OSTEOPOROSIS. Repeat every 2 years.  Lung Cancer Screening: (Low Dose CT Chest recommended if Age 46-80 years, 30 pack-year currently smoking OR have quit w/in 15years.) does not qualify.   Lung Cancer Screening Referral: N/A  Additional Screening:  Hepatitis C Screening: does qualify; Completed N/A  Vision Screening: Recommended annual ophthalmology exams for early detection of glaucoma and other disorders of the eye. Is the patient up to date  with their annual eye exam?  Yes  Who is the provider or what is the name of the office in which the patient attends annual eye exams? [facility] If pt is not established with a provider, would they like to be referred to a provider to establish care? No .   Dental Screening: Recommended annual dental exams for proper oral hygiene  Community Resource Referral / Chronic Care Management: CRR required this visit?  No   CCM required this visit?  No      Plan:     I have personally reviewed and noted the following in the patient's chart:   Medical and social history Use of alcohol, tobacco or illicit drugs  Current medications and supplements including opioid prescriptions.  Functional ability and status Nutritional status Physical activity Advanced directives List of other physicians Hospitalizations, surgeries, and ER visits in previous 12 months Vitals Screenings to include cognitive, depression, and falls Referrals and appointments  In addition, I have reviewed and discussed with patient certain preventive protocols, quality metrics, and best practice recommendations. A written personalized care plan for preventive services as well as general preventive health recommendations were provided to patient.     Burna Forts, [place]   01/10/2021   Nurse Notes: Non- Face to Face 300 minutes visit encounter.   Ms. Hulick , Thank you for taking time to come for your Medicare Wellness Visit. I appreciate your ongoing commitment to your health goals. Please review the following plan we discussed and let me know if I can assist you in the future.   These are the goals we discussed:  Goals      Manage My Medicine     Timeframe:  Long-Range Goal Priority:  High Start Date:                             Expected End Date:                       Follow Up Date 01/04/22   - call for medicine refill 2 or 3 days before it runs out - keep a list of all the medicines I take; vitamins  and herbals too - use a pillbox  to sort medicine    Why is this important?   These steps will help you keep on track with your medicines.   Notes:      Track and Manage My Blood Pressure-Hypertension     Timeframe:  Long-Range Goal Priority:  High Start Date:                             Expected End Date:                       Follow Up Date 01/04/22   - choose a place to take my blood pressure (home, clinic or office, retail store) - write blood pressure results in a log or diary    Why is this important?   You won't feel high blood pressure, but it can still hurt your blood vessels.  High blood pressure can cause heart or kidney problems. It can also cause a stroke.  Making lifestyle changes like losing a little weight or eating less salt will help.  Checking your blood pressure at home and at different times of the day can help to control blood pressure.  If the doctor prescribes medicine remember to take it the way the doctor ordered.  Call the office if you cannot afford the medicine or if there are questions about it.     Notes:         This is a list of the screening recommended for you and due dates:  Health Maintenance  Topic Date Due   Tetanus Vaccine  Never done   Zoster (Shingles) Vaccine (1 of 2) Never done   COVID-19 Vaccine (4 - Booster for Moderna series) 06/19/2020   Flu Shot  11/20/2020   DEXA scan (bone density measurement)  Completed   HPV Vaccine  Aged Out

## 2021-01-11 ENCOUNTER — Ambulatory Visit (INDEPENDENT_AMBULATORY_CARE_PROVIDER_SITE_OTHER): Payer: Medicare Other | Admitting: Cardiology

## 2021-01-11 ENCOUNTER — Encounter: Payer: Self-pay | Admitting: Cardiology

## 2021-01-11 ENCOUNTER — Other Ambulatory Visit: Payer: Self-pay

## 2021-01-11 VITALS — BP 128/60 | HR 64 | Ht 63.0 in | Wt 118.0 lb

## 2021-01-11 DIAGNOSIS — I10 Essential (primary) hypertension: Secondary | ICD-10-CM | POA: Diagnosis not present

## 2021-01-11 DIAGNOSIS — R002 Palpitations: Secondary | ICD-10-CM

## 2021-01-11 DIAGNOSIS — E782 Mixed hyperlipidemia: Secondary | ICD-10-CM | POA: Diagnosis not present

## 2021-01-11 NOTE — Progress Notes (Signed)
Cardiology Office Note:    Date:  01/11/2021   ID:  Mashonda Broski, DOB 1935-03-31, MRN [id number]  PCP:  Lillard Anes, MD  Cardiologist:  Jenne Campus, MD    Referring MD: Lillard Anes,*   Chief Complaint  Patient presents with   Follow-up  I am doing fine  History of Present Illness:    Alexine Pilant is a 86 y.o. female with past medical history significant for palpitations now successfully suppressed with very small dose of beta-blocker, essential hypertension, chronic kidney failure.  She comes today to my for follow-up overall she is doing very well denies have any chest pain tightness squeezing pressure burning chest no palpitations no dizziness overall she is very happy  Past Medical History:  Diagnosis Date   BMI 26.0-26.9,adult 09/16/2019   Chronic kidney disease, stage 3a ([place]) 05/31/2019   Essential hypertension 10/17/2017   Iron deficiency anemia 05/31/2019   Kidney disease    Stage 3   Mixed hyperlipidemia 10/17/2017   Osteoporosis 12/07/2019   Other specified hypothyroidism 10/17/2017   Palpitations 10/17/2017   Plantar wart 09/15/2019   Vitamin D insufficiency 05/31/2019    Past Surgical History:  Procedure Laterality Date   CHOLECYSTECTOMY  2001    Current Medications: Current Meds  Medication Sig   acetaminophen (TYLENOL) 500 MG tablet Take 500 mg by mouth as needed for moderate pain.   aspirin EC 81 MG tablet Take 81 mg by mouth daily.   denosumab (PROLIA) 60 MG/ML SOSY injection Inject 60 mg into the skin every 6 (six) months.   levothyroxine (SYNTHROID) 75 MCG tablet TAKE 1 TABLET EVERY DAY BEFORE BREAKFAST   lisinopril (ZESTRIL) 10 MG tablet TAKE 1 TABLET EVERY DAY   metoprolol tartrate (LOPRESSOR) 25 MG tablet TAKE 1 TABLET TWICE DAILY   Multiple Vitamin (MULTIVITAMIN) capsule Take 1 capsule by mouth daily.   omeprazole (PRILOSEC) 20 MG capsule TAKE 1 CAPSULE EVERY DAY   sertraline (ZOLOFT) 25 MG tablet Take 1 tablet (25 mg total) by  mouth daily.   simvastatin (ZOCOR) 40 MG tablet Take 1 tablet (40 mg total) by mouth daily.     Allergies:   Patient has no known allergies.   Social History   Socioeconomic History   Marital status: Widowed    Spouse name: Not on file   Number of children: 2   Years of education: Not on file   Highest education level: Not on file  Occupational History   Occupation: Retired  Tobacco Use   Smoking status: Never   Smokeless tobacco: Never  Substance and Sexual Activity   Alcohol use: Not Currently   Drug use: Never   Sexual activity: Not Currently  Other Topics Concern   Not on file  Social History Narrative   Not on file   Social Determinants of Health   Financial Resource Strain: Low Risk    Difficulty of Paying Living Expenses: Not hard at all  Food Insecurity: No Food Insecurity   Worried About Charity fundraiser in the Last Year: Never true   [place] in the Last Year: Never true  Transportation Needs: No Transportation Needs   Lack of Transportation (Medical): No   Lack of Transportation (Non-Medical): No  Physical Activity: Insufficiently Active   Days of Exercise per Week: 5 days   Minutes of Exercise per Session: 20 min  Stress: No Stress Concern Present   Feeling of Stress : Not at all  Social Connections: Socially Isolated  Frequency of Communication with Friends and Family: More than three times a week   Frequency of Social Gatherings with Friends and Family: Once a week   Attends Religious Services: Never   Marine scientist or Organizations: No   Attends Archivist Meetings: Never   Marital Status: Widowed     Family History: The patient's family history includes Heart attack in her mother; Leukemia in her father. ROS:   Please see the history of present illness.    All 14 point review of systems negative except as described per history of present illness  EKGs/Labs/Other Studies Reviewed:      Recent Labs: 12/28/2020:  ALT 21; BUN 12; Creatinine, Ser 0.97; Hemoglobin 12.0; Platelets 338; Potassium 5.5; Sodium 140; TSH 1.050  Recent Lipid Panel    Component Value Date/Time   CHOL 176 12/28/2020 0952   TRIG 109 12/28/2020 0952   HDL 81 12/28/2020 0952   CHOLHDL 2.2 12/28/2020 0952   LDLCALC 76 12/28/2020 0952    Physical Exam:    VS:  BP 128/60 (BP Location: Left Arm, Patient Position: Sitting, Cuff Size: Normal)   Pulse 64   Ht 5\' 3"  (1.6 m)   Wt 118 lb (53.5 kg)   LMP  (LMP Unknown)   SpO2 98%   BMI 20.90 kg/m     Wt Readings from Last 3 Encounters:  01/11/21 118 lb (53.5 kg)  01/10/21 119 lb (54 kg)  12/28/20 119 lb (54 kg)     GEN:  Well nourished, well developed in no acute distress HEENT: Normal NECK: No JVD; No carotid bruits LYMPHATICS: No lymphadenopathy CARDIAC: RRR, no murmurs, no rubs, no gallops RESPIRATORY:  Clear to auscultation without rales, wheezing or rhonchi  ABDOMEN: Soft, non-tender, non-distended MUSCULOSKELETAL:  No edema; No deformity  SKIN: Warm and dry LOWER EXTREMITIES: no swelling NEUROLOGIC:  Alert and oriented x 3 PSYCHIATRIC:  Normal affect   ASSESSMENT:    1. Essential hypertension   2. Palpitations   3. Mixed hyperlipidemia    PLAN:    In order of problems listed above:  Essential hypertension blood pressure well controlled continue present management.   Palpitations successfully suppressed with beta-blocker which I will continue. Dyslipidemia I did review her K PN which show me her LDL of 76 HDL 81 this is from 12/28/2020.  This is on small dose of Zocor only 40 mg which I will continue.   Medication Adjustments/Labs and Tests Ordered: Current medicines are reviewed at length with the patient today.  Concerns regarding medicines are outlined above.  Orders Placed This Encounter  Procedures   EKG 12-Lead   Medication changes: No orders of the defined types were placed in this encounter.   Signed, Park Liter, MD, [facility] 01/11/2021  1:42 PM    [place]

## 2021-01-11 NOTE — Patient Instructions (Signed)

## 2021-01-17 ENCOUNTER — Ambulatory Visit

## 2021-01-19 ENCOUNTER — Other Ambulatory Visit: Payer: Self-pay | Admitting: Legal Medicine

## 2021-01-19 DIAGNOSIS — E039 Hypothyroidism, unspecified: Secondary | ICD-10-CM

## 2021-01-19 DIAGNOSIS — I10 Essential (primary) hypertension: Secondary | ICD-10-CM | POA: Diagnosis not present

## 2021-01-19 DIAGNOSIS — F330 Major depressive disorder, recurrent, mild: Secondary | ICD-10-CM

## 2021-01-19 DIAGNOSIS — E038 Other specified hypothyroidism: Secondary | ICD-10-CM | POA: Diagnosis not present

## 2021-01-19 DIAGNOSIS — E782 Mixed hyperlipidemia: Secondary | ICD-10-CM | POA: Diagnosis not present

## 2021-01-22 NOTE — Telephone Encounter (Signed)
Refill sent to pharmacy.   

## 2021-01-31 DIAGNOSIS — Z23 Encounter for immunization: Secondary | ICD-10-CM | POA: Diagnosis not present

## 2021-02-06 ENCOUNTER — Telehealth: Payer: Self-pay

## 2021-02-06 NOTE — Chronic Care Management (AMB) (Signed)
Chronic Care Management Pharmacy Assistant   Name: Karen Briggs  MRN: [id number] DOB: [birth year]  Reason for Encounter: Disease State call for HTN   Recent office visits:  01/10/21 Philipp Ovens CMA. Annual Wellness Visit. No med changes. Follow up in 1 year.  Recent consult visits:  01/11/21 (Cardiology) Jenne Campus MD. Seen for HTN. No med changes.  Hospital visits:  None since 01/04/21  Medications: Outpatient Encounter Medications as of 02/06/2021  Medication Sig   acetaminophen (TYLENOL) 500 MG tablet Take 500 mg by mouth as needed for moderate pain.   aspirin EC 81 MG tablet Take 81 mg by mouth daily.   denosumab (PROLIA) 60 MG/ML SOSY injection Inject 60 mg into the skin every 6 (six) months.   levothyroxine (SYNTHROID) 75 MCG tablet TAKE 1 TABLET EVERY DAY BEFORE BREAKFAST   lisinopril (ZESTRIL) 10 MG tablet TAKE 1 TABLET EVERY DAY   metoprolol tartrate (LOPRESSOR) 25 MG tablet TAKE 1 TABLET TWICE DAILY   Multiple Vitamin (MULTIVITAMIN) capsule Take 1 capsule by mouth daily.   omeprazole (PRILOSEC) 20 MG capsule TAKE 1 CAPSULE EVERY DAY   sertraline (ZOLOFT) 25 MG tablet TAKE 1 TABLET (25 MG TOTAL) BY MOUTH DAILY.   simvastatin (ZOCOR) 40 MG tablet Take 1 tablet (40 mg total) by mouth daily.   No facility-administered encounter medications on file as of 02/06/2021.    Recent Office Vitals: BP Readings from Last 3 Encounters:  01/11/21 128/60  12/28/20 (!) 158/70  08/21/20 124/64   Pulse Readings from Last 3 Encounters:  01/11/21 64  12/28/20 80  08/21/20 75    Wt Readings from Last 3 Encounters:  01/11/21 118 lb (53.5 kg)  01/10/21 119 lb (54 kg)  12/28/20 119 lb (54 kg)     Kidney Function Lab Results  Component Value Date/Time   CREATININE 0.97 12/28/2020 09:52 AM   CREATININE 1.01 (H) 08/07/2020 02:09 PM   GFRNONAA 50 (L) 03/20/2020 09:46 AM   GFRAA 58 (L) 03/20/2020 09:46 AM    BMP Latest Ref Rng & Units 12/28/2020 08/07/2020 03/20/2020   Glucose 65 - 99 mg/dL 88 90 92  BUN 8 - 27 mg/dL 12 15 15   Creatinine 0.57 - 1.00 mg/dL 0.97 1.01(H) 1.03(H)  BUN/Creat Ratio 12 - 28 12 15 15   Sodium 134 - 144 mmol/L 140 141 139  Potassium 3.5 - 5.2 mmol/L 5.5(H) 5.1 4.8  Chloride 96 - 106 mmol/L 101 101 100  CO2 20 - 29 mmol/L 23 20 20   Calcium 8.7 - 10.3 mg/dL 10.2 9.9 10.4(H)     Current antihypertensive regimen:  Lisinopril 10 mg daily Metoprolol 25 mg twice daily    Patient verbally confirms she is taking the above medications as directed. Yes  How often are you checking your Blood Pressure? Pt daughter states mom use to be good at checking her BP but she is not anymore unless daughter takes it for her.  she checks her blood pressure in the morning before taking her medication.  Current home BP readings:  DATE:             BP               PULSE      02/07/21                114/60              64  Wrist or arm cuff: Arm Cuff   Any readings above 180/120? No  What recent interventions/DTPs have been made by any provider to improve Blood Pressure control since last CPP Visit: No changes   Any recent hospitalizations or ED visits since last visit with CPP? No recent visits   What diet changes have been made to improve Blood Pressure Control?  Daughter stated her mom is loosing weight but is very  particular about what she eats. She stated mom is very picky.  What exercise is being done to improve your Blood Pressure Control?  She gets out and walks with daughter throughout the week   Adherence Review: Is the patient currently on ACE/ARB medication? Yes Does the patient have >5 day gap between last estimated fill dates? Yes   Care Gaps: Last annual wellness visit? Done on 01/10/21  Star Rating Drugs:  Medication:  Last Fill: Day Supply Lisinopril   09/18/20 [place], [place] Pharmacist Assistant  [phone]

## 2021-02-20 ENCOUNTER — Ambulatory Visit (INDEPENDENT_AMBULATORY_CARE_PROVIDER_SITE_OTHER): Payer: Medicare Other

## 2021-02-20 DIAGNOSIS — Z23 Encounter for immunization: Secondary | ICD-10-CM

## 2021-02-23 ENCOUNTER — Other Ambulatory Visit: Payer: Self-pay | Admitting: Legal Medicine

## 2021-02-23 DIAGNOSIS — E782 Mixed hyperlipidemia: Secondary | ICD-10-CM

## 2021-02-23 DIAGNOSIS — K2100 Gastro-esophageal reflux disease with esophagitis, without bleeding: Secondary | ICD-10-CM

## 2021-02-23 DIAGNOSIS — E039 Hypothyroidism, unspecified: Secondary | ICD-10-CM

## 2021-02-23 DIAGNOSIS — I10 Essential (primary) hypertension: Secondary | ICD-10-CM

## 2021-03-08 ENCOUNTER — Telehealth: Payer: Self-pay

## 2021-03-08 NOTE — Chronic Care Management (AMB) (Signed)
Chronic Care Management Pharmacy Assistant   Name: Karen Briggs  MRN: [id number] DOB: 03/13/1935   Reason for Encounter: Disease State call for HTN    Recent office visits:  None   Recent consult visits:  None   Hospital visits:  None   Medications: Outpatient Encounter Medications as of 03/08/2021  Medication Sig   acetaminophen (TYLENOL) 500 MG tablet Take 500 mg by mouth as needed for moderate pain.   aspirin EC 81 MG tablet Take 81 mg by mouth daily.   denosumab (PROLIA) 60 MG/ML SOSY injection Inject 60 mg into the skin every 6 (six) months.   levothyroxine (SYNTHROID) 75 MCG tablet TAKE 1 TABLET EVERY DAY BEFORE BREAKFAST   lisinopril (ZESTRIL) 10 MG tablet TAKE 1 TABLET EVERY DAY   metoprolol tartrate (LOPRESSOR) 25 MG tablet TAKE 1 TABLET TWICE DAILY   Multiple Vitamin (MULTIVITAMIN) capsule Take 1 capsule by mouth daily.   omeprazole (PRILOSEC) 20 MG capsule TAKE 1 CAPSULE EVERY DAY   sertraline (ZOLOFT) 25 MG tablet TAKE 1 TABLET (25 MG TOTAL) BY MOUTH DAILY.   simvastatin (ZOCOR) 40 MG tablet TAKE 1 TABLET EVERY DAY   No facility-administered encounter medications on file as of 03/08/2021.    Recent Office Vitals: BP Readings from Last 3 Encounters:  01/11/21 128/60  12/28/20 (!) 158/70  08/21/20 124/64   Pulse Readings from Last 3 Encounters:  01/11/21 64  12/28/20 80  08/21/20 75    Wt Readings from Last 3 Encounters:  01/11/21 118 lb (53.5 kg)  01/10/21 119 lb (54 kg)  12/28/20 119 lb (54 kg)     Kidney Function Lab Results  Component Value Date/Time   CREATININE 0.97 12/28/2020 09:52 AM   CREATININE 1.01 (H) 08/07/2020 02:09 PM   GFRNONAA 50 (L) 03/20/2020 09:46 AM   GFRAA 58 (L) 03/20/2020 09:46 AM    BMP Latest Ref Rng & Units 12/28/2020 08/07/2020 03/20/2020  Glucose 65 - 99 mg/dL 88 90 92  BUN 8 - 27 mg/dL 12 15 15   Creatinine 0.57 - 1.00 mg/dL 0.97 1.01(H) 1.03(H)  BUN/Creat Ratio 12 - 28 12 15 15   Sodium 134 - 144 mmol/L 140  141 139  Potassium 3.5 - 5.2 mmol/L 5.5(H) 5.1 4.8  Chloride 96 - 106 mmol/L 101 101 100  CO2 20 - 29 mmol/L 23 20 20   Calcium 8.7 - 10.3 mg/dL 10.2 9.9 10.4(H)     Current antihypertensive regimen:  Lisinopril 10 mg daily Metoprolol 25 mg twice daily   Patient verbally confirms she is taking the above medications as directed. Yes  How often are you checking your Blood Pressure? infrequently  she checks her blood pressure in the afternoon after taking her medication.  Current home BP readings:              03/10/21 120/61 pulse 65  Wrist or arm cuff: Arm  Caffeine intake:No caffeine  Salt intake:Limited  OTC medications including pseudoephedrine or NSAIDs? She will take Tylenol prn   Any readings above 180/120? No  What recent interventions/DTPs have been made by any provider to improve Blood Pressure control since last CPP Visit: No recent changes   Any recent hospitalizations or ED visits since last visit with CPP? No ed visits   What diet changes have been made to improve Blood Pressure Control?  Daughter is giving her protein shakes since she was loosing weight and now has gained a few lbs.   What exercise is being done to improve  your Blood Pressure Control?  Pt is still walking but not in the last few days since its been cold   Adherence Review: Is the patient currently on ACE/ARB medication? Yes Does the patient have >5 day gap between last estimated fill dates? CPP to review  Care Gaps: Last annual wellness visit?01/10/21  Star Rating Drugs:  Medication:  Last Fill: Day Supply Simvastatin   09/19/20 90ds Lisinopril   09/18/20 [place], [place] Pharmacist Assistant  [phone]

## 2021-03-15 DIAGNOSIS — S199XXA Unspecified injury of neck, initial encounter: Secondary | ICD-10-CM | POA: Diagnosis not present

## 2021-03-15 DIAGNOSIS — S0003XA Contusion of scalp, initial encounter: Secondary | ICD-10-CM | POA: Diagnosis not present

## 2021-03-15 DIAGNOSIS — S0101XA Laceration without foreign body of scalp, initial encounter: Secondary | ICD-10-CM | POA: Diagnosis not present

## 2021-03-15 DIAGNOSIS — Z23 Encounter for immunization: Secondary | ICD-10-CM | POA: Diagnosis not present

## 2021-03-15 DIAGNOSIS — I10 Essential (primary) hypertension: Secondary | ICD-10-CM | POA: Diagnosis not present

## 2021-03-15 DIAGNOSIS — S0990XA Unspecified injury of head, initial encounter: Secondary | ICD-10-CM | POA: Diagnosis not present

## 2021-03-23 ENCOUNTER — Other Ambulatory Visit: Payer: Self-pay

## 2021-03-23 ENCOUNTER — Encounter: Payer: Self-pay | Admitting: Legal Medicine

## 2021-03-23 ENCOUNTER — Ambulatory Visit (INDEPENDENT_AMBULATORY_CARE_PROVIDER_SITE_OTHER): Payer: Medicare Other | Admitting: Legal Medicine

## 2021-03-23 DIAGNOSIS — S0101XD Laceration without foreign body of scalp, subsequent encounter: Secondary | ICD-10-CM

## 2021-03-23 NOTE — Progress Notes (Signed)
Acute Office Visit  Subjective:    Patient ID: Karen Briggs, female    DOB: 06-01-1934, 86 y.o.   MRN: [id number]  Chief Complaint  Patient presents with   Suture / Staple Removal    HPI: Patient is in today for suture removal. Larey Seat 11.24/2022 and went to ER. 2 staples installed in occiput. Negative x-rays.  No other injuries. Past Medical History:  Diagnosis Date   BMI 26.0-26.9,adult 09/16/2019   Chronic kidney disease, stage 3a (HCC) 05/31/2019   Essential hypertension 10/17/2017   Iron deficiency anemia 05/31/2019   Kidney disease    Stage 3   Mixed hyperlipidemia 10/17/2017   Osteoporosis 12/07/2019   Other specified hypothyroidism 10/17/2017   Palpitations 10/17/2017   Plantar wart 09/15/2019   Vitamin D insufficiency 05/31/2019    Past Surgical History:  Procedure Laterality Date   CHOLECYSTECTOMY  2001    Family History  Problem Relation Age of Onset   Heart attack Mother    Leukemia Father     Social History   Socioeconomic History   Marital status: Widowed    Spouse name: Not on file   Number of children: 2   Years of education: Not on file   Highest education level: Not on file  Occupational History   Occupation: Retired  Tobacco Use   Smoking status: Never   Smokeless tobacco: Never  Substance and Sexual Activity   Alcohol use: Not Currently   Drug use: Never   Sexual activity: Not Currently  Other Topics Concern   Not on file  Social History Narrative   Not on file   Social Determinants of Health   Financial Resource Strain: Low Risk    Difficulty of Paying Living Expenses: Not hard at all  Food Insecurity: No Food Insecurity   Worried About Programme researcher, broadcasting/film/video in the Last Year: Never true   Ran Out of Food in the Last Year: Never true  Transportation Needs: No Transportation Needs   Lack of Transportation (Medical): No   Lack of Transportation (Non-Medical): No  Physical Activity: Insufficiently Active   Days of Exercise per Week: 5 days    Minutes of Exercise per Session: 20 min  Stress: No Stress Concern Present   Feeling of Stress : Not at all  Social Connections: Socially Isolated   Frequency of Communication with Friends and Family: More than three times a week   Frequency of Social Gatherings with Friends and Family: Once a week   Attends Religious Services: Never   Database administrator or Organizations: No   Attends Banker Meetings: Never   Marital Status: Widowed  Catering manager Violence: Not At Risk   Fear of Current or Ex-Partner: No   Emotionally Abused: No   Physically Abused: No   Sexually Abused: No    Outpatient Medications Prior to Visit  Medication Sig Dispense Refill   acetaminophen (TYLENOL) 500 MG tablet Take 500 mg by mouth as needed for moderate pain.     aspirin EC 81 MG tablet Take 81 mg by mouth daily.     denosumab (PROLIA) 60 MG/ML SOSY injection Inject 60 mg into the skin every 6 (six) months.     levothyroxine (SYNTHROID) 75 MCG tablet TAKE 1 TABLET EVERY DAY BEFORE BREAKFAST 90 tablet 1   lisinopril (ZESTRIL) 10 MG tablet TAKE 1 TABLET EVERY DAY 90 tablet 2   metoprolol tartrate (LOPRESSOR) 25 MG tablet TAKE 1 TABLET TWICE DAILY 180 tablet 1  Multiple Vitamin (MULTIVITAMIN) capsule Take 1 capsule by mouth daily.     omeprazole (PRILOSEC) 20 MG capsule TAKE 1 CAPSULE EVERY DAY 90 capsule 2   sertraline (ZOLOFT) 25 MG tablet TAKE 1 TABLET (25 MG TOTAL) BY MOUTH DAILY. 90 tablet 2   simvastatin (ZOCOR) 40 MG tablet TAKE 1 TABLET EVERY DAY 90 tablet 0   No facility-administered medications prior to visit.    Allergies  Allergen Reactions   Levofloxacin Palpitations   Sulfa Antibiotics Nausea Only    Review of Systems  Constitutional: Negative.  Negative for activity change and appetite change.  HENT:  Negative for congestion and dental problem.   Eyes:  Negative for visual disturbance.  Respiratory:  Negative for chest tightness and shortness of breath.    Cardiovascular:  Negative for chest pain, palpitations and leg swelling.  Gastrointestinal:  Negative for abdominal distention and abdominal pain.  Genitourinary: Negative.   Musculoskeletal:  Negative for arthralgias and back pain.  Skin:  Positive for wound.  Psychiatric/Behavioral: Negative.        Objective:    Physical Exam Vitals reviewed.  Constitutional:      Appearance: Normal appearance.  Cardiovascular:     Rate and Rhythm: Normal rate and regular rhythm.     Pulses: Normal pulses.     Heart sounds: Normal heart sounds. No murmur heard.   No gallop.  Pulmonary:     Effort: Pulmonary effort is normal. No respiratory distress.     Breath sounds: Normal breath sounds. No wheezing.  Skin:    General: Skin is warm and dry.     Comments: Staples removed, no complications  Neurological:     Mental Status: She is alert.    BP 122/60 (BP Location: Left Arm, Patient Position: Sitting, Cuff Size: Normal)   Pulse 74   Temp (!) 97 F (36.1 C) (Temporal)   Ht $R'5\' 5"'Jn$  (1.651 m)   Wt 122 lb (55.3 kg)   LMP  (LMP Unknown)   SpO2 98%   BMI 20.30 kg/m  Wt Readings from Last 3 Encounters:  03/23/21 122 lb (55.3 kg)  01/11/21 118 lb (53.5 kg)  01/10/21 119 lb (54 kg)    Health Maintenance Due  Topic Date Due   Zoster Vaccines- Shingrix (1 of 2) Never done   Pneumonia Vaccine 40+ Years old (1 - PCV) Never done   COVID-19 Vaccine (4 - Booster for Moderna series) 04/16/2020    There are no preventive care reminders to display for this patient.   Lab Results  Component Value Date   TSH 1.050 12/28/2020   Lab Results  Component Value Date   WBC 9.6 12/28/2020   HGB 12.0 12/28/2020   HCT 36.1 12/28/2020   MCV 93 12/28/2020   PLT 338 12/28/2020   Lab Results  Component Value Date   NA 140 12/28/2020   K 5.5 (H) 12/28/2020   CO2 23 12/28/2020   GLUCOSE 88 12/28/2020   BUN 12 12/28/2020   CREATININE 0.97 12/28/2020   BILITOT 0.4 12/28/2020   ALKPHOS 49  12/28/2020   AST 27 12/28/2020   ALT 21 12/28/2020   PROT 7.5 12/28/2020   ALBUMIN 4.8 (H) 12/28/2020   CALCIUM 10.2 12/28/2020   EGFR 58 (L) 12/28/2020   Lab Results  Component Value Date   CHOL 176 12/28/2020   Lab Results  Component Value Date   HDL 81 12/28/2020   Lab Results  Component Value Date   LDLCALC 76 12/28/2020  Lab Results  Component Value Date   TRIG 109 12/28/2020   Lab Results  Component Value Date   CHOLHDL 2.2 12/28/2020   No results found for: HGBA1C     Assessment & Plan:   Problem List Items Addressed This Visit       Other   Laceration of scalp   Staples removed no complications       Follow-up: Return if symptoms worsen or fail to improve.  An After Visit Summary was printed and given to the patient.  Reinaldo Meeker, MD [facility] [phone]

## 2021-03-25 DIAGNOSIS — S0101XA Laceration without foreign body of scalp, initial encounter: Secondary | ICD-10-CM | POA: Insufficient documentation

## 2021-03-28 ENCOUNTER — Other Ambulatory Visit: Payer: Self-pay

## 2021-03-28 DIAGNOSIS — F330 Major depressive disorder, recurrent, mild: Secondary | ICD-10-CM

## 2021-03-28 MED ORDER — SERTRALINE HCL 25 MG PO TABS: 25.0000 mg | ORAL_TABLET | Freq: Every day | ORAL | 2 refills | Status: DC

## 2021-04-05 ENCOUNTER — Telehealth: Payer: Self-pay

## 2021-04-05 NOTE — Progress Notes (Signed)
Chronic Care Management Pharmacy Assistant   Name: Valencia Kassa  MRN: [id number] DOB: Mar 08, 1935   Reason for Encounter: Disease State call for HTN   Recent office visits:  03/23/21 Reinaldo Meeker MD. Seen for Suture/Staple removal. No med changes.  Recent consult visits:  None   Hospital visits:  Medication Reconciliation was completed by comparing discharge summary, patients EMR and Pharmacy list, and upon discussion with patient.  Admitted to the hospital on 03/15/21 due to Head Injury. Discharge date was 03/15/21. Discharged from [facility].    Medications remain the same after Hospital Discharge:??   Medications: Outpatient Encounter Medications as of 04/05/2021  Medication Sig   acetaminophen (TYLENOL) 500 MG tablet Take 500 mg by mouth as needed for moderate pain.   aspirin EC 81 MG tablet Take 81 mg by mouth daily.   denosumab (PROLIA) 60 MG/ML SOSY injection Inject 60 mg into the skin every 6 (six) months.   levothyroxine (SYNTHROID) 75 MCG tablet TAKE 1 TABLET EVERY DAY BEFORE BREAKFAST   lisinopril (ZESTRIL) 10 MG tablet TAKE 1 TABLET EVERY DAY   metoprolol tartrate (LOPRESSOR) 25 MG tablet TAKE 1 TABLET TWICE DAILY   Multiple Vitamin (MULTIVITAMIN) capsule Take 1 capsule by mouth daily.   omeprazole (PRILOSEC) 20 MG capsule TAKE 1 CAPSULE EVERY DAY   sertraline (ZOLOFT) 25 MG tablet Take 1 tablet (25 mg total) by mouth daily.   simvastatin (ZOCOR) 40 MG tablet TAKE 1 TABLET EVERY DAY   No facility-administered encounter medications on file as of 04/05/2021.    Recent Office Vitals: BP Readings from Last 3 Encounters:  03/23/21 122/60  01/11/21 128/60  12/28/20 (!) 158/70   Pulse Readings from Last 3 Encounters:  03/23/21 74  01/11/21 64  12/28/20 80    Wt Readings from Last 3 Encounters:  03/23/21 122 lb (55.3 kg)  01/11/21 118 lb (53.5 kg)  01/10/21 119 lb (54 kg)     Kidney Function Lab Results  Component Value  Date/Time   CREATININE 0.97 12/28/2020 09:52 AM   CREATININE 1.01 (H) 08/07/2020 02:09 PM   GFRNONAA 50 (L) 03/20/2020 09:46 AM   GFRAA 58 (L) 03/20/2020 09:46 AM    BMP Latest Ref Rng & Units 12/28/2020 08/07/2020 03/20/2020  Glucose 65 - 99 mg/dL 88 90 92  BUN 8 - 27 mg/dL 12 15 15   Creatinine 0.57 - 1.00 mg/dL 0.97 1.01(H) 1.03(H)  BUN/Creat Ratio 12 - 28 12 15 15   Sodium 134 - 144 mmol/L 140 141 139  Potassium 3.5 - 5.2 mmol/L 5.5(H) 5.1 4.8  Chloride 96 - 106 mmol/L 101 101 100  CO2 20 - 29 mmol/L 23 20 20   Calcium 8.7 - 10.3 mg/dL 10.2 9.9 10.4(H)     Current antihypertensive regimen:  Metoprolol Tartrate 25 mg  twice daily  Lisinopril 10 mg daily   Patient verbally confirms she is taking the above medications as directed. Yes  How often are you checking your Blood Pressure? 3-5x per week  she checks her blood pressure in the morning before taking her medication.  Current home BP readings: 120/65, 130/65  Wrist or arm cuff:Arm  Salt intake:none to limited  Any readings above 180/120? No  What recent interventions/DTPs have been made by any provider to improve Blood Pressure control since last CPP Visit: No recent changes have been made   Any recent hospitalizations or ED visits since last visit with CPP? Yes  What diet changes have been made to improve Blood  Pressure Control?  Pt daughter has been giving her ensure and she is gaining weight  What exercise is being done to improve your Blood Pressure Control?  Pt is still walking on a daily basis or several times a week  Adherence Review: Is the patient currently on ACE/ARB medication? Yes Does the patient have >5 day gap between last estimated fill dates? CPP to review  Care Gaps: Last annual wellness visit?01/10/21  Star Rating Drugs:  Medication:  Last Fill: Day Supply Lisinopril   03/13/21  90ds    12/29/20  90ds  Note: I called [facility] to verify the last two refills   Elray Mcgregor,  [place] Pharmacist Assistant  [phone]

## 2021-04-06 ENCOUNTER — Ambulatory Visit: Payer: Self-pay

## 2021-04-06 DIAGNOSIS — E782 Mixed hyperlipidemia: Secondary | ICD-10-CM

## 2021-04-06 DIAGNOSIS — E039 Hypothyroidism, unspecified: Secondary | ICD-10-CM

## 2021-04-06 DIAGNOSIS — E038 Other specified hypothyroidism: Secondary | ICD-10-CM

## 2021-04-06 DIAGNOSIS — I10 Essential (primary) hypertension: Secondary | ICD-10-CM

## 2021-04-06 NOTE — Progress Notes (Signed)
Updated blood pressure in chart with patient-reported home BP:  BP Readings from Last 3 Encounters:  03/23/21 122/60  01/11/21 128/60  12/28/20 (!) 158/70    There are no care plans that you recently modified to display for this patient.   Lane Hacker, [facility]

## 2021-04-10 DIAGNOSIS — Z1231 Encounter for screening mammogram for malignant neoplasm of breast: Secondary | ICD-10-CM | POA: Diagnosis not present

## 2021-04-10 LAB — HM MAMMOGRAPHY

## 2021-04-11 ENCOUNTER — Encounter: Payer: Self-pay | Admitting: Legal Medicine

## 2021-04-12 ENCOUNTER — Ambulatory Visit: Payer: Medicare Other

## 2021-04-19 ENCOUNTER — Ambulatory Visit: Payer: Medicare Other

## 2021-04-25 ENCOUNTER — Ambulatory Visit: Payer: Medicare Other

## 2021-05-03 ENCOUNTER — Ambulatory Visit (INDEPENDENT_AMBULATORY_CARE_PROVIDER_SITE_OTHER): Payer: Medicare Other

## 2021-05-03 ENCOUNTER — Other Ambulatory Visit: Payer: Self-pay

## 2021-05-03 DIAGNOSIS — M810 Age-related osteoporosis without current pathological fracture: Secondary | ICD-10-CM | POA: Diagnosis not present

## 2021-05-03 MED ORDER — DENOSUMAB 60 MG/ML ~~LOC~~ SOSY
60.0000 mg | PREFILLED_SYRINGE | Freq: Once | SUBCUTANEOUS | Status: AC
  Administered 2021-05-03: 60 mg via SUBCUTANEOUS

## 2021-05-03 NOTE — Progress Notes (Signed)
° °  Prolia injection given per order, patient tolerated well.  Next Prolia will be due 11/01/21.  Erie Noe, LPN 1:44 PM

## 2021-05-09 ENCOUNTER — Other Ambulatory Visit: Payer: Self-pay

## 2021-05-09 ENCOUNTER — Ambulatory Visit (INDEPENDENT_AMBULATORY_CARE_PROVIDER_SITE_OTHER): Payer: Medicare Other | Admitting: Sports Medicine

## 2021-05-09 ENCOUNTER — Encounter: Payer: Self-pay | Admitting: Sports Medicine

## 2021-05-09 DIAGNOSIS — B351 Tinea unguium: Secondary | ICD-10-CM | POA: Diagnosis not present

## 2021-05-09 DIAGNOSIS — M2040 Other hammer toe(s) (acquired), unspecified foot: Secondary | ICD-10-CM | POA: Diagnosis not present

## 2021-05-09 DIAGNOSIS — L84 Corns and callosities: Secondary | ICD-10-CM

## 2021-05-09 DIAGNOSIS — M79674 Pain in right toe(s): Secondary | ICD-10-CM | POA: Diagnosis not present

## 2021-05-09 DIAGNOSIS — M21619 Bunion of unspecified foot: Secondary | ICD-10-CM

## 2021-05-09 DIAGNOSIS — M79675 Pain in left toe(s): Secondary | ICD-10-CM | POA: Diagnosis not present

## 2021-05-09 NOTE — Progress Notes (Signed)
Subjective: Karen Briggs is a 87 y.o. female patient seen today in office with complaint of mildly painful thickened and elongated toenails; unable to trim. Patient denies any changes with medical history since last encounter over a year ago but does state that the left big toenail seems like it is lifting up and is getting loose. No other pedal complaints noted.  Patient Active Problem List   Diagnosis Date Noted   Laceration of scalp 03/25/2021   BMI 21.0-21.9, adult 12/28/2020   Malnutrition of moderate degree ([place]) 12/28/2020   Gastroenteritis 08/21/2020   Dysuria 08/21/2020   Abdominal pain 08/07/2020   Kidney disease    Osteoporosis 12/07/2019   BMI 26.0-26.9,adult 09/16/2019   Plantar wart 09/15/2019   Esophageal reflux 05/31/2019   Chronic kidney disease, stage 3a ([place]) 05/31/2019   Vitamin D insufficiency 05/31/2019   Iron deficiency anemia 05/31/2019   Palpitations 10/17/2017   Essential hypertension 10/17/2017   Mixed hyperlipidemia 10/17/2017   Other specified hypothyroidism 10/17/2017    Current Outpatient Medications on File Prior to Visit  Medication Sig Dispense Refill   acetaminophen (TYLENOL) 500 MG tablet Take 500 mg by mouth as needed for moderate pain.     aspirin EC 81 MG tablet Take 81 mg by mouth daily.     denosumab (PROLIA) 60 MG/ML SOSY injection Inject 60 mg into the skin every 6 (six) months.     levothyroxine (SYNTHROID) 75 MCG tablet TAKE 1 TABLET EVERY DAY BEFORE BREAKFAST 90 tablet 1   lisinopril (ZESTRIL) 10 MG tablet TAKE 1 TABLET EVERY DAY 90 tablet 2   metoprolol tartrate (LOPRESSOR) 25 MG tablet TAKE 1 TABLET TWICE DAILY 180 tablet 1   Multiple Vitamin (MULTIVITAMIN) capsule Take 1 capsule by mouth daily.     omeprazole (PRILOSEC) 20 MG capsule TAKE 1 CAPSULE EVERY DAY 90 capsule 2   sertraline (ZOLOFT) 25 MG tablet Take 1 tablet (25 mg total) by mouth daily. 90 tablet 2   simvastatin (ZOCOR) 40 MG tablet TAKE 1 TABLET EVERY DAY 90 tablet 0    No current facility-administered medications on file prior to visit.    Allergies  Allergen Reactions   Levofloxacin Palpitations   Sulfa Antibiotics Nausea Only    Objective: Physical Exam  General: Well developed, nourished, no acute distress, awake, alert and oriented x 3  Vascular: Dorsalis pedis artery 1/4 bilateral, Posterior tibial artery 1/4 bilateral, skin temperature warm to warm proximal to distal bilateral lower extremities, mild varicosities, minimal pedal hair present bilateral.  Neurological: Gross sensation present via light touch bilateral.   Dermatological: Skin is warm, dry, and supple bilateral, Nails 1-10 are tender, long, thick, and discolored with mild subungal debris with the left being worse with distal lifting due to onychodystrophy, no webspace macerations present bilateral, no open lesions present bilateral there is corns/hyperkeratotic tissue present, first interspace on the right at the lateral first toe and the medial second toe no signs of infection bilateral.  Musculoskeletal: Asymptomatic bunion and hammertoe boney deformities noted bilateral. Muscular strength within normal limits without painon range of motion. No pain with calf compression bilateral.  Assessment and Plan:  Problem List Items Addressed This Visit   None Visit Diagnoses     Pain due to onychomycosis of toenails of both feet    -  Primary   Corn of toe       Bunion       Hammer toe, unspecified laterality           -  Examined patient.  -Discussed treatment options for painful mycotic nails. -Mechanically debrided and reduced mycotic nails with sterile nail nipper and dremel nail file without incident. -Advised patient at this time since keratosis at the right first interspace and very minimal no debridement needed -Continue with good supportive shoes daily and if painful may use toe spacer -Recommend Vicks VapoRub to toenails as previously recommended -Advised patient to  return in 3 months for routine nail care or sooner if problems or issues arise.  Landis Martins, DPM

## 2021-05-10 ENCOUNTER — Telehealth: Payer: Self-pay

## 2021-05-10 NOTE — Progress Notes (Signed)
° ° °  Chronic Care Management Pharmacy Assistant   Name: Karen Briggs  MRN: [id number] DOB: 16-Jun-1934  Reason for Encounter: Disease State call for HTN   Recent office visits:  05/03/21 Rhae Hammock LPN. Received a Prolia 60 mg injection. No med changes.   Recent consult visits:  05/09/21 (Podiatry) Cannon Kettle, [place] DPM. Seen for nail problem. No med changes.   Hospital visits:  None  Medications: Outpatient Encounter Medications as of 05/10/2021  Medication Sig   acetaminophen (TYLENOL) 500 MG tablet Take 500 mg by mouth as needed for moderate pain.   aspirin EC 81 MG tablet Take 81 mg by mouth daily.   denosumab (PROLIA) 60 MG/ML SOSY injection Inject 60 mg into the skin every 6 (six) months.   levothyroxine (SYNTHROID) 75 MCG tablet TAKE 1 TABLET EVERY DAY BEFORE BREAKFAST   lisinopril (ZESTRIL) 10 MG tablet TAKE 1 TABLET EVERY DAY   metoprolol tartrate (LOPRESSOR) 25 MG tablet TAKE 1 TABLET TWICE DAILY   Multiple Vitamin (MULTIVITAMIN) capsule Take 1 capsule by mouth daily.   omeprazole (PRILOSEC) 20 MG capsule TAKE 1 CAPSULE EVERY DAY   sertraline (ZOLOFT) 25 MG tablet Take 1 tablet (25 mg total) by mouth daily.   simvastatin (ZOCOR) 40 MG tablet TAKE 1 TABLET EVERY DAY   No facility-administered encounter medications on file as of 05/10/2021.   Current antihypertensive regimen:  Metoprolol Tartrate 25 mg  twice daily  Lisinopril 10 mg daily    Patient verbally confirms she is taking the above medications as directed. Yes   How often are you checking your Blood Pressure? 3-5x per week   she checks her blood pressure in the morning before taking her medication.   Current home BP readings:    Wrist or arm cuff:Arm  Salt intake:none to limited  Any readings above 180/120? No  Adherence Review: Is the patient currently on ACE/ARB medication? Yes Does the patient have >5 day gap between last estimated fill dates? CPP to review   Care Gaps: Last annual wellness  visit?01/10/21   Star Rating Drugs:  Medication:                Last Fill:         Day Supply Lisinopril                      03/13/21          90ds                                     12/29/20              90ds   Note: I called [facility] to verify the last two refills    Elray Mcgregor, [place] Pharmacist Assistant  [phone]   I have been unsuccessful at completing this call after several attempts

## 2021-05-13 DIAGNOSIS — Z20828 Contact with and (suspected) exposure to other viral communicable diseases: Secondary | ICD-10-CM | POA: Diagnosis not present

## 2021-06-10 ENCOUNTER — Other Ambulatory Visit: Payer: Self-pay | Admitting: Cardiology

## 2021-06-10 DIAGNOSIS — I10 Essential (primary) hypertension: Secondary | ICD-10-CM

## 2021-06-13 ENCOUNTER — Telehealth: Payer: Self-pay

## 2021-06-13 NOTE — Chronic Care Management (AMB) (Signed)
Chronic Care Management Pharmacy Assistant   Name: Karen Briggs  MRN: [id number] DOB: 1934-05-07   Reason for Encounter: Disease State call for HTN    Recent office visits:  None  Recent consult visits:  None  Hospital visits:  None  Medications: Outpatient Encounter Medications as of 06/13/2021  Medication Sig   acetaminophen (TYLENOL) 500 MG tablet Take 500 mg by mouth as needed for moderate pain.   aspirin EC 81 MG tablet Take 81 mg by mouth daily.   denosumab (PROLIA) 60 MG/ML SOSY injection Inject 60 mg into the skin every 6 (six) months.   levothyroxine (SYNTHROID) 75 MCG tablet TAKE 1 TABLET EVERY DAY BEFORE BREAKFAST   lisinopril (ZESTRIL) 10 MG tablet TAKE 1 TABLET EVERY DAY   metoprolol tartrate (LOPRESSOR) 25 MG tablet TAKE 1 TABLET TWICE DAILY   Multiple Vitamin (MULTIVITAMIN) capsule Take 1 capsule by mouth daily.   omeprazole (PRILOSEC) 20 MG capsule TAKE 1 CAPSULE EVERY DAY   sertraline (ZOLOFT) 25 MG tablet Take 1 tablet (25 mg total) by mouth daily.   simvastatin (ZOCOR) 40 MG tablet TAKE 1 TABLET EVERY DAY   No facility-administered encounter medications on file as of 06/13/2021.     Recent Office Vitals: BP Readings from Last 3 Encounters:  04/06/21 120/65  03/23/21 122/60  01/11/21 128/60   Pulse Readings from Last 3 Encounters:  03/23/21 74  01/11/21 64  12/28/20 80    Wt Readings from Last 3 Encounters:  03/23/21 122 lb (55.3 kg)  01/11/21 118 lb (53.5 kg)  01/10/21 119 lb (54 kg)     Kidney Function Lab Results  Component Value Date/Time   CREATININE 0.97 12/28/2020 09:52 AM   CREATININE 1.01 (H) 08/07/2020 02:09 PM   GFRNONAA 50 (L) 03/20/2020 09:46 AM   GFRAA 58 (L) 03/20/2020 09:46 AM    BMP Latest Ref Rng & Units 12/28/2020 08/07/2020 03/20/2020  Glucose 65 - 99 mg/dL 88 90 92  BUN 8 - 27 mg/dL 12 15 15   Creatinine 0.57 - 1.00 mg/dL 0.97 1.01(H) 1.03(H)  BUN/Creat Ratio 12 - 28 12 15 15   Sodium 134 - 144 mmol/L 140 141 139   Potassium 3.5 - 5.2 mmol/L 5.5(H) 5.1 4.8  Chloride 96 - 106 mmol/L 101 101 100  CO2 20 - 29 mmol/L 23 20 20   Calcium 8.7 - 10.3 mg/dL 10.2 9.9 10.4(H)     Current antihypertensive regimen:  Metoprolol Tartrate 25 mg  twice daily  Lisinopril 10 mg daily  Patient verbally confirms she is taking the above medications as directed. Yes  How often are you checking your Blood Pressure? daily  Current home BP readings:  Pt daughter checks her BP but has had Covid and cannot be around her mother right now. Last BP was good and it was 120/72   Wrist or arm cuff:Arm  Caffeine intake:Limted to None  Salt intake:Limted to None   Any readings above 180/120? No  What recent interventions/DTPs have been made by any provider to improve Blood Pressure control since last CPP Visit: Daughter denies any changes   Any recent hospitalizations or ED visits since last visit with CPP? No  What diet changes have been made to improve Blood Pressure Control?  Daughter stated no diet changes   Adherence Review: Is the patient currently on ACE/ARB medication? Yes Does the patient have >5 day gap between last estimated fill dates? CPP to review  Care Gaps: Last annual wellness visit?01/10/21  Star Rating Drugs:  Medication:  Last Fill: Day Supply Lisinopril                      03/13/21          90ds                                     12/29/20              [place], [place] Pharmacist Assistant  [phone]

## 2021-06-23 DIAGNOSIS — Z20822 Contact with and (suspected) exposure to covid-19: Secondary | ICD-10-CM | POA: Diagnosis not present

## 2021-06-28 ENCOUNTER — Ambulatory Visit: Payer: Medicare Other | Admitting: Legal Medicine

## 2021-07-09 ENCOUNTER — Telehealth: Payer: Self-pay

## 2021-07-09 NOTE — Progress Notes (Signed)
? ? ?Chronic Care Management ?Pharmacy Assistant  ? ?Name: Karen Briggs  MRN: [id number] DOB: 06-08-1934 ? ? ?Reason for Encounter: Disease State call for HTN  ?  ?Recent office visits:  ?07/11/21 Reinaldo Meeker MD. Seen for routine visit. No med changes.  ? ?Recent consult visits:  ?None ? ?Hospital visits:  ?None ? ?Medications: ?Outpatient Encounter Medications as of 07/09/2021  ?Medication Sig  ? acetaminophen (TYLENOL) 500 MG tablet Take 500 mg by mouth as needed for moderate pain.  ? aspirin EC 81 MG tablet Take 81 mg by mouth daily.  ? denosumab (PROLIA) 60 MG/ML SOSY injection Inject 60 mg into the skin every 6 (six) months.  ? levothyroxine (SYNTHROID) 75 MCG tablet TAKE 1 TABLET EVERY DAY BEFORE BREAKFAST  ? lisinopril (ZESTRIL) 10 MG tablet TAKE 1 TABLET EVERY DAY  ? metoprolol tartrate (LOPRESSOR) 25 MG tablet TAKE 1 TABLET TWICE DAILY  ? Multiple Vitamin (MULTIVITAMIN) capsule Take 1 capsule by mouth daily.  ? omeprazole (PRILOSEC) 20 MG capsule TAKE 1 CAPSULE EVERY DAY  ? sertraline (ZOLOFT) 25 MG tablet Take 1 tablet (25 mg total) by mouth daily.  ? simvastatin (ZOCOR) 40 MG tablet TAKE 1 TABLET EVERY DAY  ? ?No facility-administered encounter medications on file as of 07/09/2021.  ? ? ? ?Recent Office Vitals: ?BP Readings from Last 3 Encounters:  ?04/06/21 120/65  ?03/23/21 122/60  ?01/11/21 128/60  ? ?Pulse Readings from Last 3 Encounters:  ?03/23/21 74  ?01/11/21 64  ?12/28/20 80  ?  ?Wt Readings from Last 3 Encounters:  ?03/23/21 122 lb (55.3 kg)  ?01/11/21 118 lb (53.5 kg)  ?01/10/21 119 lb (54 kg)  ?  ? ?Kidney Function ?Lab Results  ?Component Value Date/Time  ? CREATININE 0.97 12/28/2020 09:52 AM  ? CREATININE 1.01 (H) 08/07/2020 02:09 PM  ? GFRNONAA 50 (L) 03/20/2020 09:46 AM  ? GFRAA 58 (L) 03/20/2020 09:46 AM  ? ? ?BMP Latest Ref Rng & Units 12/28/2020 08/07/2020 03/20/2020  ?Glucose 65 - 99 mg/dL 88 90 92  ?BUN 8 - 27 mg/dL '12 15 15  '$ ?Creatinine 0.57 - 1.00 mg/dL 0.97 1.01(H) 1.03(H)   ?BUN/Creat Ratio 12 - '28 12 15 15  '$ ?Sodium 134 - 144 mmol/L 140 141 139  ?Potassium 3.5 - 5.2 mmol/L 5.5(H) 5.1 4.8  ?Chloride 96 - 106 mmol/L 101 101 100  ?CO2 20 - 29 mmol/L '23 20 20  '$ ?Calcium 8.7 - 10.3 mg/dL 10.2 9.9 10.4(H)  ? ? ? ?Current antihypertensive regimen:  ?Metoprolol Tartrate 25 mg  twice daily  ?Lisinopril 10 mg daily  ?Patient verbally confirms she is taking the above medications as directed. Yes ? ?How often are you checking your Blood Pressure? daily ? ?Current home BP readings: 128/65 on 07/11/21 ? ? ?Wrist or arm cuff:Arm  ?Caffeine intake:Limited to None ?Salt intake:Limited to None ? ?Any readings above 180/120? No ? ?What recent interventions/DTPs have been made by any provider to improve Blood Pressure control since last CPP Visit: Pt denies any changes  ? ?Any recent hospitalizations or ED visits since last visit with CPP? No ? ?What diet changes have been made to improve Blood Pressure Control?  ?Drinking her ensures to help gain weight ? ?What exercise is being done to improve your Blood Pressure Control?  ?Pt is not exercising right now but she will when the weather warms up  ? ?Adherence Review: ?Is the patient currently on ACE/ARB medication? Yes ?Does the patient have >5 day gap between last estimated fill  dates? CPP to review ? ?Care Gaps: ?Last annual wellness visit?01/10/21 ? ?Star Rating Drugs:  ?Medication:  Last Fill: Day Supply  ?Lisinopril                    05/23/21  90ds ?          03/11/21           90ds ?                                   ?Elray Mcgregor, CMA ?Clinical Pharmacist Assistant  ?[phone]  ?

## 2021-07-09 NOTE — Progress Notes (Signed)
? ?Subjective:  ?Patient ID: Karen Briggs, female    DOB: 1934/11/06  Age: 87 y.o. MRN: [id number] ? ?Chief Complaint  ?Patient presents with  ? Gastroesophageal Reflux  ? Hyperlipidemia  ? Hypertension  ? Hypothyroidism  ? ? ?HPI ? ?Hypothyroidism: She is taking levothyroxine 75 mcg daily. ?Patient has HYPOTHYROIDISM.  Diagnosed 20 years ago.  Patient has stable thyroid readings.  Patient is having no symptoms.  Last TSH was normal.  continue dosage of thyroid medicine.  ? ?Hyperlipidemia: Takes Simvastatin 40 mg daily. ?Patient presents with hyperlipidemia.  Compliance with treatment has been good; patient takes medicines as directed, maintains low cholesterol diet, follows up as directed, and maintains exercise regimen.  Patient is using simvastatin without problems.  ? ?GERD: She takes Omeprazole 20 mg daily. ? ?Osteoporosis: She is on Prolia 60 mg injection every 6 months. ? ?Hypertension: Patient is taking Aspirin 81 mg daily, metoprolol 25 mg twice a day, lisinopril 10 mg daily. ?Patient presents for follow up of hypertension.  Patient tolerating metprolol, isinopril well with side effects.  Patient was diagnosed with hypertension 2010 so has been treated for hypertension for 12 years.Patient is working on maintaining diet and exercise regimen and follows up as directed. Complication include none.  ? ?  ?Current Outpatient Medications on File Prior to Visit  ?Medication Sig Dispense Refill  ? acetaminophen (TYLENOL) 500 MG tablet Take 500 mg by mouth as needed for moderate pain.    ? aspirin EC 81 MG tablet Take 81 mg by mouth daily.    ? denosumab (PROLIA) 60 MG/ML SOSY injection Inject 60 mg into the skin every 6 (six) months.    ? levothyroxine (SYNTHROID) 75 MCG tablet TAKE 1 TABLET EVERY DAY BEFORE BREAKFAST 90 tablet 1  ? lisinopril (ZESTRIL) 10 MG tablet TAKE 1 TABLET EVERY DAY 90 tablet 2  ? metoprolol tartrate (LOPRESSOR) 25 MG tablet TAKE 1 TABLET TWICE DAILY 180 tablet 1  ? Multiple Vitamin  (MULTIVITAMIN) capsule Take 1 capsule by mouth daily.    ? omeprazole (PRILOSEC) 20 MG capsule TAKE 1 CAPSULE EVERY DAY 90 capsule 2  ? sertraline (ZOLOFT) 25 MG tablet Take 1 tablet (25 mg total) by mouth daily. 90 tablet 2  ? simvastatin (ZOCOR) 40 MG tablet TAKE 1 TABLET EVERY DAY 90 tablet 0  ? ?No current facility-administered medications on file prior to visit.  ? ?Past Medical History:  ?Diagnosis Date  ? BMI 26.0-26.9,adult 09/16/2019  ? Chronic kidney disease, stage 3a ([place]) 05/31/2019  ? Essential hypertension 10/17/2017  ? Iron deficiency anemia 05/31/2019  ? Kidney disease   ? Stage 3  ? Mixed hyperlipidemia 10/17/2017  ? Osteoporosis 12/07/2019  ? Other specified hypothyroidism 10/17/2017  ? Palpitations 10/17/2017  ? Plantar wart 09/15/2019  ? Vitamin D insufficiency 05/31/2019  ? ?Past Surgical History:  ?Procedure Laterality Date  ? CHOLECYSTECTOMY  2001  ?  ?Family History  ?Problem Relation Age of Onset  ? Heart attack Mother   ? Leukemia Father   ? ?Social History  ? ?Socioeconomic History  ? Marital status: Widowed  ?  Spouse name: Not on file  ? Number of children: 2  ? Years of education: Not on file  ? Highest education level: Not on file  ?Occupational History  ? Occupation: Retired  ?Tobacco Use  ? Smoking status: Never  ? Smokeless tobacco: Never  ?Substance and Sexual Activity  ? Alcohol use: Not Currently  ? Drug use: Never  ? Sexual activity: Not  Currently  ?Other Topics Concern  ? Not on file  ?Social History Narrative  ? Not on file  ? ?Social Determinants of Health  ? ?Financial Resource Strain: Low Risk   ? Difficulty of Paying Living Expenses: Not hard at all  ?Food Insecurity: No Food Insecurity  ? Worried About Charity fundraiser in the Last Year: Never true  ? Ran Out of Food in the Last Year: Never true  ?Transportation Needs: No Transportation Needs  ? Lack of Transportation (Medical): No  ? Lack of Transportation (Non-Medical): No  ?Physical Activity: Insufficiently Active  ? Days of  Exercise per Week: 5 days  ? Minutes of Exercise per Session: 20 min  ?Stress: No Stress Concern Present  ? Feeling of Stress : Not at all  ?Social Connections: Socially Isolated  ? Frequency of Communication with Friends and Family: More than three times a week  ? Frequency of Social Gatherings with Friends and Family: Once a week  ? Attends Religious Services: Never  ? Active Member of Clubs or Organizations: No  ? Attends Archivist Meetings: Never  ? Marital Status: Widowed  ? ? ?Review of Systems  ?Constitutional:  Negative for appetite change, chills, fatigue and fever.  ?HENT:  Negative for congestion, ear discharge, ear pain, rhinorrhea, sinus pressure, sneezing and sore throat.   ?Eyes:  Negative for visual disturbance.  ?Respiratory:  Negative for cough, chest tightness, shortness of breath and wheezing.   ?Cardiovascular:  Negative for chest pain, palpitations and leg swelling.  ?Gastrointestinal:  Negative for abdominal pain, diarrhea, nausea and vomiting.  ?Endocrine: Negative for polydipsia, polyphagia and polyuria.  ?Genitourinary:  Negative for difficulty urinating, dysuria, frequency, hematuria, menstrual problem, urgency, vaginal bleeding, vaginal discharge and vaginal pain.  ?Musculoskeletal:  Negative for back pain, gait problem, joint swelling, myalgias and neck pain.  ?Neurological:  Negative for dizziness, seizures, syncope, weakness, numbness and headaches.  ?Psychiatric/Behavioral:  Negative for agitation, confusion, hallucinations, sleep disturbance and suicidal ideas. The patient is not nervous/anxious.   ? ? ?Objective:  ?BP 128/72   Pulse 64   Temp (!) 97.5 ?F (36.4 ?C)   Ht '5\' 5"'$  (1.651 m)   Wt 119 lb 12.8 oz (54.3 kg)   LMP  (LMP Unknown)   SpO2 98%   BMI 19.94 kg/m?  ? ? ?  07/11/2021  ?  8:18 AM 04/06/2021  ?  9:00 AM 03/23/2021  ? 10:51 AM  ?BP/Weight  ?Systolic BP 546 270 350  ?Diastolic BP 72 65 60  ?Wt. (Lbs) 119.8  122  ?BMI 19.94 kg/m2  20.3 kg/m2  ? ? ?Physical  Exam ?Vitals reviewed.  ?Constitutional:   ?   General: She is not in acute distress. ?   Appearance: Normal appearance. She is obese.  ?HENT:  ?   Head: Normocephalic.  ?   Right Ear: Tympanic membrane normal.  ?   Left Ear: Tympanic membrane normal.  ?   Nose: Nose normal.  ?   Mouth/Throat:  ?   Mouth: Mucous membranes are moist.  ?   Pharynx: Oropharynx is clear.  ?Eyes:  ?   Extraocular Movements: Extraocular movements intact.  ?   Conjunctiva/sclera: Conjunctivae normal.  ?   Pupils: Pupils are equal, round, and reactive to light.  ?Cardiovascular:  ?   Rate and Rhythm: Normal rate and regular rhythm.  ?   Pulses: Normal pulses.  ?   Heart sounds: Normal heart sounds. No murmur heard. ?  No  gallop.  ?Pulmonary:  ?   Effort: Pulmonary effort is normal. No respiratory distress.  ?   Breath sounds: No wheezing.  ?Abdominal:  ?   General: Abdomen is flat. Bowel sounds are normal. There is no distension.  ?   Tenderness: There is no abdominal tenderness.  ?Musculoskeletal:  ?   Cervical back: Normal range of motion and neck supple.  ?   Right lower leg: No edema.  ?   Left lower leg: No edema.  ?   Comments: Decreased muscle mass  ?Skin: ?   General: Skin is warm.  ?   Capillary Refill: Capillary refill takes less than 2 seconds.  ?Neurological:  ?   General: No focal deficit present.  ?   Mental Status: She is alert and oriented to person, place, and time.  ?   Gait: Gait normal.  ?   Deep Tendon Reflexes: Reflexes normal.  ?Psychiatric:     ?   Mood and Affect: Mood normal.     ?   Thought Content: Thought content normal.  ? ? ? ?  ? ?Lab Results  ?Component Value Date  ? WBC 9.6 12/28/2020  ? HGB 12.0 12/28/2020  ? HCT 36.1 12/28/2020  ? PLT 338 12/28/2020  ? GLUCOSE 88 12/28/2020  ? CHOL 176 12/28/2020  ? TRIG 109 12/28/2020  ? HDL 81 12/28/2020  ? [place] 76 12/28/2020  ? ALT 21 12/28/2020  ? AST 27 12/28/2020  ? NA 140 12/28/2020  ? K 5.5 (H) 12/28/2020  ? CL 101 12/28/2020  ? CREATININE 0.97 12/28/2020  ?  BUN 12 12/28/2020  ? CO2 23 12/28/2020  ? TSH 1.050 12/28/2020  ? ? ?  07/11/2021  ?  8:23 AM 07/11/2021  ?  8:22 AM 01/10/2021  ?  1:42 PM 12/28/2020  ?  9:21 AM 09/16/2019  ?  9:27 AM  ?Depression screen PHQ 2/9  ?Decreased

## 2021-07-11 ENCOUNTER — Other Ambulatory Visit: Payer: Self-pay

## 2021-07-11 ENCOUNTER — Ambulatory Visit (INDEPENDENT_AMBULATORY_CARE_PROVIDER_SITE_OTHER): Payer: Medicare Other | Admitting: Legal Medicine

## 2021-07-11 ENCOUNTER — Encounter: Payer: Self-pay | Admitting: Legal Medicine

## 2021-07-11 VITALS — BP 128/72 | HR 64 | Temp 97.5°F | Ht 65.0 in | Wt 119.8 lb

## 2021-07-11 DIAGNOSIS — F330 Major depressive disorder, recurrent, mild: Secondary | ICD-10-CM

## 2021-07-11 DIAGNOSIS — I10 Essential (primary) hypertension: Secondary | ICD-10-CM

## 2021-07-11 DIAGNOSIS — E038 Other specified hypothyroidism: Secondary | ICD-10-CM | POA: Diagnosis not present

## 2021-07-11 DIAGNOSIS — N1831 Chronic kidney disease, stage 3a: Secondary | ICD-10-CM

## 2021-07-11 DIAGNOSIS — M810 Age-related osteoporosis without current pathological fracture: Secondary | ICD-10-CM | POA: Diagnosis not present

## 2021-07-11 DIAGNOSIS — E440 Moderate protein-calorie malnutrition: Secondary | ICD-10-CM

## 2021-07-11 DIAGNOSIS — E559 Vitamin D deficiency, unspecified: Secondary | ICD-10-CM

## 2021-07-11 DIAGNOSIS — Z23 Encounter for immunization: Secondary | ICD-10-CM

## 2021-07-11 DIAGNOSIS — E782 Mixed hyperlipidemia: Secondary | ICD-10-CM

## 2021-07-11 DIAGNOSIS — K2100 Gastro-esophageal reflux disease with esophagitis, without bleeding: Secondary | ICD-10-CM

## 2021-07-12 LAB — CBC WITH DIFFERENTIAL/PLATELET
Basophils Absolute: 0.1 10*3/uL (ref 0.0–0.2)
Basos: 1 %
EOS (ABSOLUTE): 0.2 10*3/uL (ref 0.0–0.4)
Eos: 2 %
Hematocrit: 35.2 % (ref 34.0–46.6)
Hemoglobin: 11.6 g/dL (ref 11.1–15.9)
Immature Grans (Abs): 0 10*3/uL (ref 0.0–0.1)
Immature Granulocytes: 0 %
Lymphocytes Absolute: 2.9 10*3/uL (ref 0.7–3.1)
Lymphs: 41 %
MCH: 30.7 pg (ref 26.6–33.0)
MCHC: 33 g/dL (ref 31.5–35.7)
MCV: 93 fL (ref 79–97)
Monocytes Absolute: 0.5 10*3/uL (ref 0.1–0.9)
Monocytes: 7 %
Neutrophils Absolute: 3.5 10*3/uL (ref 1.4–7.0)
Neutrophils: 49 %
Platelets: 359 10*3/uL (ref 150–450)
RBC: 3.78 x10E6/uL (ref 3.77–5.28)
RDW: 13.3 % (ref 11.7–15.4)
WBC: 7.1 10*3/uL (ref 3.4–10.8)

## 2021-07-12 LAB — LIPID PANEL
Chol/HDL Ratio: 2.3 ratio (ref 0.0–4.4)
Cholesterol, Total: 196 mg/dL (ref 100–199)
HDL: 87 mg/dL (ref >39)
LDL Chol Calc (NIH): 93 mg/dL (ref 0–99)
Triglycerides: 90 mg/dL (ref 0–149)
VLDL Cholesterol Cal: 16 mg/dL (ref 5–40)

## 2021-07-12 LAB — COMPREHENSIVE METABOLIC PANEL
ALT: 22 IU/L (ref 0–32)
AST: 25 IU/L (ref 0–40)
Albumin/Globulin Ratio: 1.7 (ref 1.2–2.2)
Albumin: 4.6 g/dL (ref 3.6–4.6)
Alkaline Phosphatase: 47 IU/L (ref 44–121)
BUN/Creatinine Ratio: 12 (ref 12–28)
BUN: 14 mg/dL (ref 8–27)
Bilirubin Total: 0.4 mg/dL (ref 0.0–1.2)
CO2: 22 mmol/L (ref 20–29)
Calcium: 9.7 mg/dL (ref 8.7–10.3)
Chloride: 102 mmol/L (ref 96–106)
Creatinine, Ser: 1.13 mg/dL — ABNORMAL HIGH (ref 0.57–1.00)
Globulin, Total: 2.7 g/dL (ref 1.5–4.5)
Glucose: 85 mg/dL (ref 70–99)
Potassium: 4.8 mmol/L (ref 3.5–5.2)
Sodium: 139 mmol/L (ref 134–144)
Total Protein: 7.3 g/dL (ref 6.0–8.5)
eGFR: 48 mL/min/{1.73_m2} — ABNORMAL LOW (ref >59)

## 2021-07-12 LAB — VITAMIN D 25 HYDROXY (VIT D DEFICIENCY, FRACTURES): Vit D, 25-Hydroxy: 31.2 ng/mL (ref 30.0–100.0)

## 2021-07-12 LAB — CARDIOVASCULAR RISK ASSESSMENT

## 2021-07-12 LAB — TSH: TSH: 1.53 u[IU]/mL (ref 0.450–4.500)

## 2021-07-12 NOTE — Progress Notes (Signed)
Kidney tests stage 3b, liver tests normal, potassium now normal 4.8, cholesterol normal, TSH 1.53 normal, CBC normal, vitamin D 31.2 low normal range continue supplements ?lp

## 2021-07-13 ENCOUNTER — Ambulatory Visit: Payer: Medicare Other | Admitting: Legal Medicine

## 2021-07-19 ENCOUNTER — Other Ambulatory Visit: Payer: Self-pay | Admitting: Family Medicine

## 2021-07-19 DIAGNOSIS — E782 Mixed hyperlipidemia: Secondary | ICD-10-CM

## 2021-07-19 NOTE — Telephone Encounter (Signed)
Refill sent to pharmacy.   

## 2021-07-26 DIAGNOSIS — Z20822 Contact with and (suspected) exposure to covid-19: Secondary | ICD-10-CM | POA: Diagnosis not present

## 2021-07-31 DIAGNOSIS — Z1152 Encounter for screening for COVID-19: Secondary | ICD-10-CM | POA: Diagnosis not present

## 2021-07-31 DIAGNOSIS — Z20828 Contact with and (suspected) exposure to other viral communicable diseases: Secondary | ICD-10-CM | POA: Diagnosis not present

## 2021-08-07 ENCOUNTER — Ambulatory Visit: Payer: Medicare Other | Admitting: Sports Medicine

## 2021-08-10 ENCOUNTER — Telehealth: Payer: Self-pay

## 2021-08-10 NOTE — Progress Notes (Signed)
? ? ?Chronic Care Management ?Pharmacy Assistant  ? ?Name: Karen Briggs  MRN: [id number] DOB: 04-18-1935 ? ? ?Reason for Encounter: Disease State call for HTN  ?  ?Recent office visits:  ?07/11/21 Reinaldo Meeker MD. Seen for routine visit. No med changes. ? ?Recent consult visits:  ?None ? ?Hospital visits:  ?None ? ?Medications: ?Outpatient Encounter Medications as of 08/10/2021  ?Medication Sig  ? acetaminophen (TYLENOL) 500 MG tablet Take 500 mg by mouth as needed for moderate pain.  ? aspirin EC 81 MG tablet Take 81 mg by mouth daily.  ? denosumab (PROLIA) 60 MG/ML SOSY injection Inject 60 mg into the skin every 6 (six) months.  ? levothyroxine (SYNTHROID) 75 MCG tablet TAKE 1 TABLET EVERY DAY BEFORE BREAKFAST  ? lisinopril (ZESTRIL) 10 MG tablet TAKE 1 TABLET EVERY DAY  ? metoprolol tartrate (LOPRESSOR) 25 MG tablet TAKE 1 TABLET TWICE DAILY  ? Multiple Vitamin (MULTIVITAMIN) capsule Take 1 capsule by mouth daily.  ? omeprazole (PRILOSEC) 20 MG capsule TAKE 1 CAPSULE EVERY DAY  ? sertraline (ZOLOFT) 25 MG tablet Take 1 tablet (25 mg total) by mouth daily.  ? simvastatin (ZOCOR) 40 MG tablet TAKE 1 TABLET EVERY DAY  ? ?No facility-administered encounter medications on file as of 08/10/2021.  ? ? ? ?Recent Office Vitals: ?BP Readings from Last 3 Encounters:  ?07/11/21 128/72  ?04/06/21 120/65  ?03/23/21 122/60  ? ?Pulse Readings from Last 3 Encounters:  ?07/11/21 64  ?03/23/21 74  ?01/11/21 64  ?  ?Wt Readings from Last 3 Encounters:  ?07/11/21 119 lb 12.8 oz (54.3 kg)  ?03/23/21 122 lb (55.3 kg)  ?01/11/21 118 lb (53.5 kg)  ?  ? ?Kidney Function ?Lab Results  ?Component Value Date/Time  ? CREATININE 1.13 (H) 07/11/2021 09:06 AM  ? CREATININE 0.97 12/28/2020 09:52 AM  ? GFRNONAA 50 (L) 03/20/2020 09:46 AM  ? GFRAA 58 (L) 03/20/2020 09:46 AM  ? ? ? ?  Latest Ref Rng & Units 07/11/2021  ?  9:06 AM 12/28/2020  ?  9:52 AM 08/07/2020  ?  2:09 PM  ?BMP  ?Glucose 70 - 99 mg/dL 85   88   90    ?BUN 8 - 27 mg/dL '14   12   15     '$ ?Creatinine 0.57 - 1.00 mg/dL 1.13   0.97   1.01    ?BUN/Creat Ratio 12 - '28 12   12   15    '$ ?Sodium 134 - 144 mmol/L 139   140   141    ?Potassium 3.5 - 5.2 mmol/L 4.8   5.5   5.1    ?Chloride 96 - 106 mmol/L 102   101   101    ?CO2 20 - 29 mmol/L '22   23   20    '$ ?Calcium 8.7 - 10.3 mg/dL 9.7   10.2   9.9    ? ? ? ?Current antihypertensive regimen:  ?Metoprolol Tartrate 25 mg  twice daily  ?Lisinopril 10 mg daily  ?Patient verbally confirms she is taking the above medications as directed. Yes ? ?How often are you checking your Blood Pressure? infrequently ? ?Current home BP readings: 118/70 on 08/06/21 ? ?Wrist or arm cuff:Arm  ?Caffeine intake:Limited to None ?Salt intake:Limited to None ?  ?Any readings above 180/120? No ? ?What recent interventions/DTPs have been made by any provider to improve Blood Pressure control since last CPP Visit: No changes ? ?Any recent hospitalizations or ED visits since last visit with CPP?  No ? ?What diet changes have been made to improve Blood Pressure Control?  ?Pt is still drinking ensures and doing well  ? ?What exercise is being done to improve your Blood Pressure Control?  ?Pt gets out and walks now the weather is warm  ? ?Adherence Review: ?Is the patient currently on ACE/ARB medication? Yes ?Does the patient have >5 day gap between last estimated fill dates? CPP to review ? ?Care Gaps: ?Last annual wellness visit?01/10/21 ? ?Star Rating Drugs:  ?Medication:  Last Fill: Day Supply ?Lisinopril   07/30/21 90ds ?   05/23/21  90ds ? ?Elray Mcgregor, CMA ?Clinical Pharmacist Assistant  ?[phone]  ?

## 2021-08-15 ENCOUNTER — Ambulatory Visit (INDEPENDENT_AMBULATORY_CARE_PROVIDER_SITE_OTHER): Payer: Medicare Other | Admitting: Sports Medicine

## 2021-08-15 ENCOUNTER — Encounter: Payer: Self-pay | Admitting: Sports Medicine

## 2021-08-15 DIAGNOSIS — M79675 Pain in left toe(s): Secondary | ICD-10-CM

## 2021-08-15 DIAGNOSIS — M79674 Pain in right toe(s): Secondary | ICD-10-CM | POA: Diagnosis not present

## 2021-08-15 DIAGNOSIS — L84 Corns and callosities: Secondary | ICD-10-CM

## 2021-08-15 DIAGNOSIS — B351 Tinea unguium: Secondary | ICD-10-CM

## 2021-08-15 DIAGNOSIS — M2040 Other hammer toe(s) (acquired), unspecified foot: Secondary | ICD-10-CM

## 2021-08-15 DIAGNOSIS — M21619 Bunion of unspecified foot: Secondary | ICD-10-CM

## 2021-08-15 NOTE — Progress Notes (Signed)
Subjective: ?Karen Briggs is a 87 y.o. female patient seen today in office with complaint of mildly painful thickened and elongated toenails; unable to trim. Patient admits a little soreness to the nail area at the left big toe. ?No other pedal complaints noted. ? ?Patient is assisted by daughter this visit. ? ?Patient Active Problem List  ? Diagnosis Date Noted  ? Major depressive disorder, recurrent episode, mild with melancholic features ([place]) 97/58/8325  ? Malnutrition of moderate degree ([place]) 12/28/2020  ? Osteoporosis 12/07/2019  ? Plantar wart 09/15/2019  ? Esophageal reflux 05/31/2019  ? Chronic kidney disease, stage 3a ([place]) 05/31/2019  ? Vitamin D insufficiency 05/31/2019  ? Iron deficiency anemia 05/31/2019  ? Essential hypertension 10/17/2017  ? Mixed hyperlipidemia 10/17/2017  ? Other specified hypothyroidism 10/17/2017  ? ? ?Current Outpatient Medications on File Prior to Visit  ?Medication Sig Dispense Refill  ? acetaminophen (TYLENOL) 500 MG tablet Take 500 mg by mouth as needed for moderate pain.    ? aspirin EC 81 MG tablet Take 81 mg by mouth daily.    ? denosumab (PROLIA) 60 MG/ML SOSY injection Inject 60 mg into the skin every 6 (six) months.    ? levothyroxine (SYNTHROID) 75 MCG tablet TAKE 1 TABLET EVERY DAY BEFORE BREAKFAST 90 tablet 1  ? lisinopril (ZESTRIL) 10 MG tablet TAKE 1 TABLET EVERY DAY 90 tablet 2  ? metoprolol tartrate (LOPRESSOR) 25 MG tablet TAKE 1 TABLET TWICE DAILY 180 tablet 1  ? Multiple Vitamin (MULTIVITAMIN) capsule Take 1 capsule by mouth daily.    ? omeprazole (PRILOSEC) 20 MG capsule TAKE 1 CAPSULE EVERY DAY 90 capsule 2  ? sertraline (ZOLOFT) 25 MG tablet Take 1 tablet (25 mg total) by mouth daily. 90 tablet 2  ? simvastatin (ZOCOR) 40 MG tablet TAKE 1 TABLET EVERY DAY 90 tablet 0  ? SODIUM FLUORIDE 5000 PPM 1.1 % GEL dental gel Take by mouth.    ? ?No current facility-administered medications on file prior to visit.  ? ? ?Allergies  ?Allergen Reactions  ? Levofloxacin  Palpitations  ? Sulfa Antibiotics Nausea Only  ? ? ?Objective: ?Physical Exam ? ?General: Well developed, nourished, no acute distress, awake, alert and oriented x 3 ? ?Vascular: Dorsalis pedis artery 1/4 bilateral, Posterior tibial artery 1/4 bilateral, skin temperature warm to warm proximal to distal bilateral lower extremities, mild varicosities, minimal pedal hair present bilateral. ? ?Neurological: Gross sensation present via light touch bilateral.  ? ?Dermatological: Skin is warm, dry, and supple bilateral, Nails 1-10 are tender, long, thick, and discolored with mild subungal debris with the left being worse with distal lifting due to onychodystrophy, no webspace macerations present bilateral, no open lesions present bilateral there is very minimal corns/hyperkeratotic tissue present, first interspace on the right at the lateral first toe and the medial second toe no signs of infection bilateral. ? ?Musculoskeletal: Asymptomatic bunion and hammertoe boney deformities noted bilateral. Muscular strength within normal limits without painon range of motion. No pain with calf compression bilateral. ? ?Assessment and Plan:  ?Problem List Items Addressed This Visit   ?None ?Visit Diagnoses   ? ? Pain due to onychomycosis of toenails of both feet    -  Primary  ? Corn of toe      ? Bunion      ? Hammer toe, unspecified laterality      ? ?  ? ? ?-Examined patient.  ?-Discussed treatment options for painful mycotic nails. ?-Mechanically debrided and reduced all painful mycotic nails  with sterile nail nipper and dremel nail file without incident. ?-Advised patient at this time since keratosis at the right first interspace and very minimal no debridement needed as previous ?-Continue with good supportive shoes daily and if painful may use toe spacer like previously recommended ?-Recommend Vicks VapoRub to toenails as previously recommended ?-Advised patient to return in 3 months for routine nail care or sooner if problems  or issues arise. ? ?Landis Martins, DPM ? ?

## 2021-08-20 DIAGNOSIS — Z20822 Contact with and (suspected) exposure to covid-19: Secondary | ICD-10-CM | POA: Diagnosis not present

## 2021-09-05 ENCOUNTER — Telehealth: Payer: Self-pay

## 2021-09-05 NOTE — Chronic Care Management (AMB) (Signed)
    Chronic Care Management Pharmacy Assistant   Name: Karen Briggs  MRN: [id number] DOB: [birth year]   Reason for Encounter: Prolia Benefit Verification   Patient's last Prolia injection was on 05/03/2021, patient due for next injection on 10/31/2021, benefit verification sent through [place] details: Physician Purchase available, No prior authorization required. Benefits subject to a $226.00 deductible ($226.00 met) and 20% co-insurance for the administration and cost of Prolia. The benefits provided on this Verification of Benefits form are Medical Benefits and are the patient's In-Network benefits for Prolia.  Sent request to Rhae Hammock, LPN to order Prolia for patient's next injection.   Medications: Outpatient Encounter Medications as of 09/05/2021  Medication Sig   acetaminophen (TYLENOL) 500 MG tablet Take 500 mg by mouth as needed for moderate pain.   aspirin EC 81 MG tablet Take 81 mg by mouth daily.   denosumab (PROLIA) 60 MG/ML SOSY injection Inject 60 mg into the skin every 6 (six) months.   levothyroxine (SYNTHROID) 75 MCG tablet TAKE 1 TABLET EVERY DAY BEFORE BREAKFAST   lisinopril (ZESTRIL) 10 MG tablet TAKE 1 TABLET EVERY DAY   metoprolol tartrate (LOPRESSOR) 25 MG tablet TAKE 1 TABLET TWICE DAILY   Multiple Vitamin (MULTIVITAMIN) capsule Take 1 capsule by mouth daily.   omeprazole (PRILOSEC) 20 MG capsule TAKE 1 CAPSULE EVERY DAY   sertraline (ZOLOFT) 25 MG tablet Take 1 tablet (25 mg total) by mouth daily.   simvastatin (ZOCOR) 40 MG tablet TAKE 1 TABLET EVERY DAY   SODIUM FLUORIDE 5000 PPM 1.1 % GEL dental gel Take by mouth.   No facility-administered encounter medications on file as of 09/05/2021.    Pattricia Boss, [place] Pharmacist Assistant [phone]

## 2021-09-12 DIAGNOSIS — Z20828 Contact with and (suspected) exposure to other viral communicable diseases: Secondary | ICD-10-CM | POA: Diagnosis not present

## 2021-09-18 DIAGNOSIS — Z20828 Contact with and (suspected) exposure to other viral communicable diseases: Secondary | ICD-10-CM | POA: Diagnosis not present

## 2021-09-30 ENCOUNTER — Other Ambulatory Visit: Payer: Self-pay | Admitting: Family Medicine

## 2021-09-30 DIAGNOSIS — I10 Essential (primary) hypertension: Secondary | ICD-10-CM

## 2021-09-30 DIAGNOSIS — E039 Hypothyroidism, unspecified: Secondary | ICD-10-CM

## 2021-10-15 ENCOUNTER — Other Ambulatory Visit: Payer: Self-pay

## 2021-10-15 DIAGNOSIS — F330 Major depressive disorder, recurrent, mild: Secondary | ICD-10-CM

## 2021-10-15 MED ORDER — SERTRALINE HCL 25 MG PO TABS: 25.0000 mg | ORAL_TABLET | Freq: Every day | ORAL | 1 refills | Status: DC

## 2021-10-15 NOTE — Telephone Encounter (Signed)
Patient's daughter called requesting refill of Sertraline be sent to [facility].  Last refilled 03/28/21 #90 with 2 refills.  Patient's next appointment is scheduled for 01/22/22.

## 2021-11-01 ENCOUNTER — Ambulatory Visit (INDEPENDENT_AMBULATORY_CARE_PROVIDER_SITE_OTHER): Payer: Medicare Other

## 2021-11-01 ENCOUNTER — Ambulatory Visit: Payer: Medicare Other

## 2021-11-01 DIAGNOSIS — M810 Age-related osteoporosis without current pathological fracture: Secondary | ICD-10-CM

## 2021-11-01 MED ORDER — DENOSUMAB 60 MG/ML ~~LOC~~ SOSY
60.0000 mg | PREFILLED_SYRINGE | Freq: Once | SUBCUTANEOUS | Status: AC
  Administered 2021-11-01: 60 mg via SUBCUTANEOUS

## 2021-11-01 NOTE — Progress Notes (Signed)
Karen Briggs comes in for her prolia injection.  She has been tolerating the medication very well.

## 2021-11-04 ENCOUNTER — Other Ambulatory Visit: Payer: Self-pay | Admitting: Legal Medicine

## 2021-11-04 DIAGNOSIS — F330 Major depressive disorder, recurrent, mild: Secondary | ICD-10-CM

## 2021-11-05 ENCOUNTER — Telehealth: Payer: Self-pay

## 2021-11-05 NOTE — Telephone Encounter (Signed)
Attempted to call patient to schedule AWV. No answer, left vm for return call.   Royce Macadamia, [place] 11/05/21 8:52 AM

## 2021-11-12 ENCOUNTER — Ambulatory Visit (INDEPENDENT_AMBULATORY_CARE_PROVIDER_SITE_OTHER): Payer: Medicare Other | Admitting: Podiatry

## 2021-11-12 ENCOUNTER — Encounter: Payer: Self-pay | Admitting: Podiatry

## 2021-11-12 DIAGNOSIS — M79674 Pain in right toe(s): Secondary | ICD-10-CM | POA: Diagnosis not present

## 2021-11-12 DIAGNOSIS — M79675 Pain in left toe(s): Secondary | ICD-10-CM

## 2021-11-12 DIAGNOSIS — B351 Tinea unguium: Secondary | ICD-10-CM | POA: Diagnosis not present

## 2021-11-12 NOTE — Progress Notes (Signed)
Subjective: Karen Briggs is a 87 y.o. female patient seen today in office with complaint of mildly painful thickened and elongated toenails; unable to trim. Patient admits a little soreness to the nail area at the left big toe. No other pedal complaints noted.  Patient is assisted by daughter this visit.  Patient Active Problem List   Diagnosis Date Noted   Major depressive disorder, recurrent episode, mild with melancholic features ([place]) 86/76/1948   Malnutrition of moderate degree ([place]) 12/28/2020   Osteoporosis 12/07/2019   Plantar wart 09/15/2019   Esophageal reflux 05/31/2019   Chronic kidney disease, stage 3a ([place]) 05/31/2019   Vitamin D insufficiency 05/31/2019   Iron deficiency anemia 05/31/2019   Essential hypertension 10/17/2017   Mixed hyperlipidemia 10/17/2017   Other specified hypothyroidism 10/17/2017    Current Outpatient Medications on File Prior to Visit  Medication Sig Dispense Refill   acetaminophen (TYLENOL) 500 MG tablet Take 500 mg by mouth as needed for moderate pain.     aspirin EC 81 MG tablet Take 81 mg by mouth daily.     denosumab (PROLIA) 60 MG/ML SOSY injection Inject 60 mg into the skin every 6 (six) months.     levothyroxine (SYNTHROID) 75 MCG tablet TAKE 1 TABLET EVERY DAY BEFORE BREAKFAST 90 tablet 1   lisinopril (ZESTRIL) 10 MG tablet TAKE 1 TABLET EVERY DAY 90 tablet 2   metoprolol tartrate (LOPRESSOR) 25 MG tablet TAKE 1 TABLET TWICE DAILY 180 tablet 1   Multiple Vitamin (MULTIVITAMIN) capsule Take 1 capsule by mouth daily.     omeprazole (PRILOSEC) 20 MG capsule TAKE 1 CAPSULE EVERY DAY 90 capsule 2   sertraline (ZOLOFT) 25 MG tablet TAKE 1 TABLET EVERY DAY 90 tablet 1   simvastatin (ZOCOR) 40 MG tablet TAKE 1 TABLET EVERY DAY 90 tablet 0   SODIUM FLUORIDE 5000 PPM 1.1 % GEL dental gel Take by mouth.     No current facility-administered medications on file prior to visit.    Allergies  Allergen Reactions   Levofloxacin Palpitations    Sulfa Antibiotics Nausea Only    Objective: Physical Exam  General: Well developed, nourished, no acute distress, awake, alert and oriented x 3  Vascular: Dorsalis pedis artery 1/4 bilateral, Posterior tibial artery 1/4 bilateral, skin temperature warm to warm proximal to distal bilateral lower extremities, mild varicosities, minimal pedal hair present bilateral.  Neurological: Gross sensation present via light touch bilateral.   Dermatological: Skin is warm, dry, and supple bilateral, Nails 1-10 are tender, long, thick, and discolored with mild subungal debris with the left being worse with distal lifting due to onychodystrophy, no webspace macerations present bilateral, no open lesions present bilateral there is very minimal corns/hyperkeratotic tissue present, first interspace on the right at the lateral first toe and the medial second toe no signs of infection bilateral.  Musculoskeletal: Asymptomatic bunion and hammertoe boney deformities noted bilateral. Muscular strength within normal limits without painon range of motion. No pain with calf compression bilateral.  Assessment and Plan:  Problem List Items Addressed This Visit   None    -Examined patient.  -Discussed treatment options for painful mycotic nails. -Mechanically debrided and reduced all painful mycotic nails with sterile nail nipper and dremel nail file without incident. -Advised patient at this time since keratosis at the right first interspace and very minimal no debridement needed as previous -Continue with good supportive shoes daily and if painful may use toe spacer like previously recommended -Advised patient to return in 3 months for  routine nail care or sooner if problems or issues arise.  Lorenda Peck, DPM

## 2021-11-20 ENCOUNTER — Ambulatory Visit: Payer: Medicare Other | Admitting: Legal Medicine

## 2021-12-14 ENCOUNTER — Other Ambulatory Visit: Payer: Self-pay | Admitting: Family Medicine

## 2021-12-14 ENCOUNTER — Other Ambulatory Visit: Payer: Self-pay | Admitting: Legal Medicine

## 2021-12-14 DIAGNOSIS — K2100 Gastro-esophageal reflux disease with esophagitis, without bleeding: Secondary | ICD-10-CM

## 2021-12-14 DIAGNOSIS — E782 Mixed hyperlipidemia: Secondary | ICD-10-CM

## 2022-01-09 DIAGNOSIS — Z23 Encounter for immunization: Secondary | ICD-10-CM | POA: Diagnosis not present

## 2022-01-22 ENCOUNTER — Encounter: Payer: Self-pay | Admitting: Family Medicine

## 2022-01-22 ENCOUNTER — Ambulatory Visit (INDEPENDENT_AMBULATORY_CARE_PROVIDER_SITE_OTHER): Payer: Medicare Other | Admitting: Family Medicine

## 2022-01-22 ENCOUNTER — Ambulatory Visit: Payer: Medicare Other | Admitting: Legal Medicine

## 2022-01-22 VITALS — BP 132/56 | HR 72 | Temp 96.8°F | Resp 16 | Ht 65.0 in | Wt 121.0 lb

## 2022-01-22 DIAGNOSIS — R413 Other amnesia: Secondary | ICD-10-CM | POA: Diagnosis not present

## 2022-01-22 DIAGNOSIS — F411 Generalized anxiety disorder: Secondary | ICD-10-CM

## 2022-01-22 DIAGNOSIS — E038 Other specified hypothyroidism: Secondary | ICD-10-CM | POA: Diagnosis not present

## 2022-01-22 DIAGNOSIS — I10 Essential (primary) hypertension: Secondary | ICD-10-CM | POA: Diagnosis not present

## 2022-01-22 DIAGNOSIS — Z23 Encounter for immunization: Secondary | ICD-10-CM | POA: Diagnosis not present

## 2022-01-22 DIAGNOSIS — N1831 Chronic kidney disease, stage 3a: Secondary | ICD-10-CM

## 2022-01-22 DIAGNOSIS — E782 Mixed hyperlipidemia: Secondary | ICD-10-CM

## 2022-01-22 DIAGNOSIS — E559 Vitamin D deficiency, unspecified: Secondary | ICD-10-CM | POA: Diagnosis not present

## 2022-01-22 DIAGNOSIS — F330 Major depressive disorder, recurrent, mild: Secondary | ICD-10-CM | POA: Diagnosis not present

## 2022-01-22 DIAGNOSIS — Z0001 Encounter for general adult medical examination with abnormal findings: Secondary | ICD-10-CM

## 2022-01-22 DIAGNOSIS — M810 Age-related osteoporosis without current pathological fracture: Secondary | ICD-10-CM

## 2022-01-22 MED ORDER — SERTRALINE HCL 50 MG PO TABS: 50.0000 mg | ORAL_TABLET | Freq: Every day | ORAL | 1 refills | Status: DC

## 2022-01-22 NOTE — Patient Instructions (Signed)
For Anxiety: Increase sertraline (zoloft) to 50 mg daily. You may double up your 25 mg until gone.   Things to do to keep yourself healthy  - Exercise at least 30-45 minutes a day, 3-4 days a week.  - Eat a low-fat diet with lots of fruits and vegetables, up to 7-9 servings per day. Increase protein in diet.  - Seatbelts can save your life. Wear them always.  - Smoke detectors on every level of your home, check batteries every year.  - Eye Doctor - have an eye exam every 1-2 years  - Safe sex - if you may be exposed to STDs, use a condom.  - [place]. Choose someone to speak for you if you are not able. Please bring [place] a copy.  - Depression is common in our stressful world.If you're feeling down or losing interest in things you normally enjoy, please come in for a visit.

## 2022-01-22 NOTE — Progress Notes (Signed)
Subjective:  Patient ID: Karen Briggs, female    DOB: 10/20/1934  Age: 87 y.o. MRN: [id number]  Chief Complaint  Patient presents with   Annual Exam    HPI Hypothyroidism: She is taking levothyroxine 75 mcg daily.  Diagnosed 20 years ago.    Hyperlipidemia: Takes Simvastatin 40 mg daily. Eats low cholesterol diet and and maintains exercise regimen.  Tolerating medicine.   Depression:    01/25/2022    3:17 PM 07/11/2021    8:23 AM 07/11/2021    8:22 AM  PHQ9 SCORE ONLY  PHQ-9 Total Score 0 0 0      12/28/2020    9:21 AM  GAD 7 : Generalized Anxiety Score  Nervous, Anxious, on Edge 3  Control/stop worrying 3  Worry too much - different things 3  Trouble relaxing 2  Restless 0  Easily annoyed or irritable 0  Afraid - awful might happen 3  Total GAD 7 Score 14  Anxiety Difficulty Somewhat difficult     GERD: She takes Omeprazole 20 mg daily.  Osteoporosis: She is on Prolia 60 mg injection every 6 months.Has had 3 shots.   Hypertension: Patient is taking Aspirin 81 mg daily, metoprolol 25 mg twice a day, lisinopril 10 mg daily. Has history of palpitations previously. Sounds like SVT>  . Well Adult Physical: Patient here for a comprehensive physical exam.The patient reports no problems Do you take any herbs or supplements that were not prescribed by a doctor? no Are you taking calcium supplements? yes Are you taking aspirin daily? yes  Physical ("At Risk" items are starred): Patient's last physical exam was 1 year ago .  Safety: reviewed ; Patient wears a seat belt, has smoke detectors, has carbon monoxide detectors, practices appropriate gun safety, and wears sunscreen with extended sun exposure. Dental Care: biannual cleanings, brushes and flosses daily. Ophthalmology/Optometry: Annual visit.  Hearing loss: wears hearing aides Vision impairments: wears glasses      07/11/2021    8:22 AM 01/10/2021    1:48 PM 12/28/2020    9:11 AM 09/16/2019    9:26 AM  Fall Risk    Falls in the past year? 0 0 0 1  Number falls in past yr: 0 0 0 0  Injury with Fall? 0 0 0 0  Risk for fall due to :   History of fall(s)   Follow up  Falls prevention discussed Education provided Falls evaluation completed        01/25/2022    3:17 PM 07/11/2021    8:23 AM 07/11/2021    8:22 AM 01/10/2021    1:42 PM 12/28/2020    9:21 AM  Depression screen PHQ 2/9  Decreased Interest 0 0 0 0 3  Down, Depressed, Hopeless 0 0 0 0 3  PHQ - 2 Score 0 0 0 0 6  Altered sleeping 0 0  0 0  Tired, decreased energy 0 0  0 1  Change in appetite 0 0  0 0  Feeling bad or failure about yourself  0 0  0 0  Trouble concentrating 0 0  0 0  Moving slowly or fidgety/restless 0 0  0 0  Suicidal thoughts 0 0  0 0  PHQ-9 Score 0 0  0 7  Difficult doing work/chores Not difficult at all Not difficult at all  Not difficult at all Somewhat difficult       Functional Status Survey: Is the patient deaf or have difficulty hearing?: No Does the patient  have difficulty seeing, even when wearing glasses/contacts?: No Does the patient have difficulty concentrating, remembering, or making decisions?: No Does the patient have difficulty walking or climbing stairs?: No Does the patient have difficulty dressing or bathing?: No Does the patient have difficulty doing errands alone such as visiting a doctor's office or shopping?: No   Health Maintenance  Topic Date Due   Mammogram  04/10/2022   COVID-19 Vaccine (5 - Moderna series) 05/11/2022   Tetanus Vaccine  03/16/2031   Pneumonia Vaccine  Completed   Flu Shot  Completed   DEXA scan (bone density measurement)  Completed   Zoster (Shingles) Vaccine  Completed   HPV Vaccine  Aged Out     Social Hx   Social History   Socioeconomic History   Marital status: Widowed    Spouse name: Not on file   Number of children: 2   Years of education: Not on file   Highest education level: Not on file  Occupational History   Occupation: Retired  Tobacco Use    Smoking status: Never   Smokeless tobacco: Never  Substance and Sexual Activity   Alcohol use: Not Currently   Drug use: Never   Sexual activity: Not Currently  Other Topics Concern   Not on file  Social History Narrative   Not on file   Social Determinants of Health   Financial Resource Strain: Low Risk  (01/22/2022)   Overall Financial Resource Strain (CARDIA)    Difficulty of Paying Living Expenses: Not hard at all  Food Insecurity: No Food Insecurity (01/22/2022)   Hunger Vital Sign    Worried About Running Out of Food in the Last Year: Never true    [place] in the Last Year: Never true  Transportation Needs: No Transportation Needs (01/10/2021)   PRAPARE - Hydrologist (Medical): No    Lack of Transportation (Non-Medical): No  Physical Activity: Insufficiently Active (01/22/2022)   Exercise Vital Sign    Days of Exercise per Week: 4 days    Minutes of Exercise per Session: 20 min  Stress: Stress Concern Present (01/22/2022)   [place]    Feeling of Stress : To some extent  Social Connections: Unknown (01/22/2022)   Social Connection and Isolation Panel [NHANES]    Frequency of Communication with Friends and Family: More than three times a week    Frequency of Social Gatherings with Friends and Family: Three times a week    Attends Religious Services: Not on file    Active Member of Clubs or Organizations: Not on file    Attends Club or Organization Meetings: Not on file    Marital Status: Not on file   Past Medical History:  Diagnosis Date   BMI 26.0-26.9,adult 09/16/2019   Chronic kidney disease, stage 3a ([place]) 05/31/2019   Essential hypertension 10/17/2017   Iron deficiency anemia 05/31/2019   Kidney disease    Stage 3   Mixed hyperlipidemia 10/17/2017   Osteoporosis 12/07/2019   Other specified hypothyroidism 10/17/2017   Palpitations 10/17/2017   Plantar wart 09/15/2019    Vitamin D insufficiency 05/31/2019   Family History  Problem Relation Age of Onset   Heart attack Mother    Leukemia Father     Review of Systems  Constitutional:  Negative for chills, fatigue and fever.  HENT:  Negative for congestion, ear pain and sore throat.   Respiratory:  Negative for cough and  shortness of breath.   Cardiovascular:  Negative for chest pain.  Gastrointestinal:  Negative for abdominal pain, constipation, diarrhea, nausea and vomiting.  Genitourinary:  Negative for dysuria and urgency.  Musculoskeletal:  Negative for arthralgias and myalgias.  Skin:  Negative for rash.  Neurological:  Negative for dizziness and headaches.  Psychiatric/Behavioral:  Negative for dysphoric mood. The patient is not nervous/anxious.      Objective:  BP (!) 132/56   Pulse 72   Temp (!) 96.8 F (36 C)   Resp 16   Ht '5\' 5"'$  (1.651 m)   Wt 121 lb (54.9 kg)   LMP  (LMP Unknown)   BMI 20.14 kg/m      01/22/2022    9:09 AM 07/11/2021    8:18 AM 04/06/2021    9:00 AM  BP/Weight  Systolic BP 701 779 390  Diastolic BP 56 72 65  Wt. (Lbs) 121 119.8   BMI 20.14 kg/m2 19.94 kg/m2     Physical Exam Vitals reviewed.  Constitutional:      Appearance: Normal appearance. She is normal weight.  Neck:     Vascular: No carotid bruit.  Cardiovascular:     Rate and Rhythm: Normal rate and regular rhythm.     Heart sounds: Normal heart sounds.  Pulmonary:     Effort: Pulmonary effort is normal. No respiratory distress.     Breath sounds: Normal breath sounds.  Abdominal:     General: Abdomen is flat. Bowel sounds are normal.     Palpations: Abdomen is soft.     Tenderness: There is no abdominal tenderness.  Skin:    General: Skin is dry.     Comments: Dry spot on nose.   Neurological:     Mental Status: She is alert and oriented to person, place, and time.  Psychiatric:        Mood and Affect: Mood normal.        Behavior: Behavior normal.     Lab Results  Component  Value Date   WBC 7.2 01/22/2022   HGB 12.0 01/22/2022   HCT 34.7 01/22/2022   PLT 401 01/22/2022   GLUCOSE 91 01/22/2022   CHOL 184 01/22/2022   TRIG 78 01/22/2022   HDL 89 01/22/2022   LDLCALC 81 01/22/2022   ALT 21 01/22/2022   AST 29 01/22/2022   NA 135 01/22/2022   K 5.3 (H) 01/22/2022   CL 97 01/22/2022   CREATININE 1.09 (H) 01/22/2022   BUN 14 01/22/2022   CO2 22 01/22/2022   TSH 0.473 01/22/2022      Assessment & Plan:   Problem List Items Addressed This Visit       Cardiovascular and Mediastinum   Essential hypertension    Well controlled.  No changes to medicines. Continue Aspirin 81 mg daily, metoprolol 25 mg twice a day, lisinopril 10 mg daily.  Continue to work on eating a healthy diet and exercise.  Labs drawn today.        Relevant Orders   CBC with Differential/Platelet (Completed)   Comprehensive metabolic panel (Completed)     Endocrine   Other specified hypothyroidism    Previously well controlled Continue Synthroid at current dose  Recheck TSH and adjust Synthroid as indicated        Relevant Orders   TSH (Completed)     Musculoskeletal and Integument   Osteoporosis    Continue prolia.        Genitourinary   Chronic kidney disease, stage  3a ([place]) (Chronic)    stable        Other   Mixed hyperlipidemia    Well controlled.  No changes to medicines. Continue simvastatin 40 mg daily.  Continue to work on eating a healthy diet and exercise.  Labs drawn today.        Relevant Orders   Lipid panel (Completed)   Cardiovascular Risk Assessment (Completed)   Vitamin D insufficiency    Check level      Relevant Orders   VITAMIN D 25 Hydroxy (Vit-D Deficiency, Fractures) (Completed)   Major depressive disorder, recurrent episode, mild with melancholic features (HCC)    Stable, but anxiety worsening. Increase zoloft to 50 mg daily.      Relevant Medications   sertraline (ZOLOFT) 50 MG tablet   Encounter for general adult  medical examination with abnormal findings - Primary     Things to do to keep yourself healthy  - Exercise at least 30-45 minutes a day, 3-4 days a week.  - Eat a low-fat diet with lots of fruits and vegetables, up to 7-9 servings per day. Increase protein in diet.  - Seatbelts can save your life. Wear them always.  - Smoke detectors on every level of your home, check batteries every year.  - Eye Doctor - have an eye exam every 1-2 years  - Safe sex - if you may be exposed to STDs, use a condom.  - [place]. Choose someone to speak for you if you are not able. Please bring [place] a copy.  - Depression is common in our stressful world.If you're feeling down or losing interest in things you normally enjoy, please come in for a visit.        Need for influenza vaccination   Relevant Orders   Flu Vaccine QUAD High Dose(Fluad) (Completed)   Memory loss    Check labs      Relevant Orders   B12 and Folate Panel (Completed)   Methylmalonic acid, serum (Completed)   GAD (generalized anxiety disorder)    Worsened. Increase zoloft to 50 mg daily.       Relevant Medications   sertraline (ZOLOFT) 50 MG tablet     Meds ordered this encounter  Medications   sertraline (ZOLOFT) 50 MG tablet    Sig: Take 1 tablet (50 mg total) by mouth daily.    Dispense:  90 tablet    Refill:  1    These are the goals we discussed:  Goals      Manage My Medicine     Timeframe:  Long-Range Goal Priority:  High Start Date:                             Expected End Date:                       Follow Up Date 01/04/22   - call for medicine refill 2 or 3 days before it runs out - keep a list of all the medicines I take; vitamins and herbals too - use a pillbox to sort medicine    Why is this important?   These steps will help you keep on track with your medicines.   Notes:      Track and Manage My Blood Pressure-Hypertension     Timeframe:  Long-Range Goal Priority:   High Start Date:  Expected End Date:                       Follow Up Date 01/04/22   - choose a place to take my blood pressure (home, clinic or office, retail store) - write blood pressure results in a log or diary    Why is this important?   You won't feel high blood pressure, but it can still hurt your blood vessels.  High blood pressure can cause heart or kidney problems. It can also cause a stroke.  Making lifestyle changes like losing a little weight or eating less salt will help.  Checking your blood pressure at home and at different times of the day can help to control blood pressure.  If the doctor prescribes medicine remember to take it the way the doctor ordered.  Call the office if you cannot afford the medicine or if there are questions about it.     Notes:          This is a list of the screening recommended for you and due dates:  Health Maintenance  Topic Date Due   Mammogram  04/10/2022   COVID-19 Vaccine (5 - Moderna series) 05/11/2022   Tetanus Vaccine  03/16/2031   Pneumonia Vaccine  Completed   Flu Shot  Completed   DEXA scan (bone density measurement)  Completed   Zoster (Shingles) Vaccine  Completed   HPV Vaccine  Aged Out     AN INDIVIDUALIZED CARE PLAN: was established or reinforced today.   SELF MANAGEMENT: The patient and I together assessed ways to personally work towards obtaining the recommended goals  Support needs The patient and/or family needs were assessed and services were offered and not necessary at this time.    Follow-up: Return in about 6 months (around 07/24/2022) for chronic fasting.  Rochel Brome, MD [facility] [phone]

## 2022-01-24 LAB — CBC WITH DIFFERENTIAL/PLATELET
Basophils Absolute: 0.1 10*3/uL (ref 0.0–0.2)
Basos: 1 %
EOS (ABSOLUTE): 0.1 10*3/uL (ref 0.0–0.4)
Eos: 1 %
Hematocrit: 34.7 % (ref 34.0–46.6)
Hemoglobin: 12 g/dL (ref 11.1–15.9)
Immature Grans (Abs): 0 10*3/uL (ref 0.0–0.1)
Immature Granulocytes: 0 %
Lymphocytes Absolute: 2.2 10*3/uL (ref 0.7–3.1)
Lymphs: 31 %
MCH: 31 pg (ref 26.6–33.0)
MCHC: 34.6 g/dL (ref 31.5–35.7)
MCV: 90 fL (ref 79–97)
Monocytes Absolute: 0.5 10*3/uL (ref 0.1–0.9)
Monocytes: 8 %
Neutrophils Absolute: 4.3 10*3/uL (ref 1.4–7.0)
Neutrophils: 59 %
Platelets: 401 10*3/uL (ref 150–450)
RBC: 3.87 x10E6/uL (ref 3.77–5.28)
RDW: 11.9 % (ref 11.7–15.4)
WBC: 7.2 10*3/uL (ref 3.4–10.8)

## 2022-01-24 LAB — LIPID PANEL
Chol/HDL Ratio: 2.1 ratio (ref 0.0–4.4)
Cholesterol, Total: 184 mg/dL (ref 100–199)
HDL: 89 mg/dL (ref >39)
LDL Chol Calc (NIH): 81 mg/dL (ref 0–99)
Triglycerides: 78 mg/dL (ref 0–149)
VLDL Cholesterol Cal: 14 mg/dL (ref 5–40)

## 2022-01-24 LAB — COMPREHENSIVE METABOLIC PANEL
ALT: 21 IU/L (ref 0–32)
AST: 29 IU/L (ref 0–40)
Albumin/Globulin Ratio: 2.2 (ref 1.2–2.2)
Albumin: 5.1 g/dL — ABNORMAL HIGH (ref 3.7–4.7)
Alkaline Phosphatase: 60 IU/L (ref 44–121)
BUN/Creatinine Ratio: 13 (ref 12–28)
BUN: 14 mg/dL (ref 8–27)
Bilirubin Total: 0.5 mg/dL (ref 0.0–1.2)
CO2: 22 mmol/L (ref 20–29)
Calcium: 10.2 mg/dL (ref 8.7–10.3)
Chloride: 97 mmol/L (ref 96–106)
Creatinine, Ser: 1.09 mg/dL — ABNORMAL HIGH (ref 0.57–1.00)
Globulin, Total: 2.3 g/dL (ref 1.5–4.5)
Glucose: 91 mg/dL (ref 70–99)
Potassium: 5.3 mmol/L — ABNORMAL HIGH (ref 3.5–5.2)
Sodium: 135 mmol/L (ref 134–144)
Total Protein: 7.4 g/dL (ref 6.0–8.5)
eGFR: 50 mL/min/{1.73_m2} — ABNORMAL LOW (ref >59)

## 2022-01-24 LAB — B12 AND FOLATE PANEL
Folate: >20 ng/mL (ref >3.0)
Vitamin B-12: 776 pg/mL (ref 232–1245)

## 2022-01-24 LAB — CARDIOVASCULAR RISK ASSESSMENT

## 2022-01-24 LAB — VITAMIN D 25 HYDROXY (VIT D DEFICIENCY, FRACTURES): Vit D, 25-Hydroxy: 40.2 ng/mL (ref 30.0–100.0)

## 2022-01-24 LAB — METHYLMALONIC ACID, SERUM: Methylmalonic Acid: 126 nmol/L (ref 0–378)

## 2022-01-24 LAB — TSH: TSH: 0.473 u[IU]/mL (ref 0.450–4.500)

## 2022-01-25 DIAGNOSIS — F0390 Unspecified dementia without behavioral disturbance: Secondary | ICD-10-CM | POA: Insufficient documentation

## 2022-01-25 DIAGNOSIS — Z0001 Encounter for general adult medical examination with abnormal findings: Secondary | ICD-10-CM | POA: Insufficient documentation

## 2022-01-25 DIAGNOSIS — R413 Other amnesia: Secondary | ICD-10-CM | POA: Insufficient documentation

## 2022-01-25 DIAGNOSIS — F411 Generalized anxiety disorder: Secondary | ICD-10-CM | POA: Insufficient documentation

## 2022-01-25 DIAGNOSIS — Z23 Encounter for immunization: Secondary | ICD-10-CM | POA: Insufficient documentation

## 2022-01-25 NOTE — Assessment & Plan Note (Signed)
stable °

## 2022-01-25 NOTE — Assessment & Plan Note (Signed)
Well controlled.  No changes to medicines. Continue simvastatin 40 mg daily  Continue to work on eating a healthy diet and exercise.  Labs drawn today.   

## 2022-01-25 NOTE — Assessment & Plan Note (Signed)
  Things to do to keep yourself healthy  - Exercise at least 30-45 minutes a day, 3-4 days a week.  - Eat a low-fat diet with lots of fruits and vegetables, up to 7-9 servings per day. Increase protein in diet.  - Seatbelts can save your life. Wear them always.  - Smoke detectors on every level of your home, check batteries every year.  - Eye Doctor - have an eye exam every 1-2 years  - Safe sex - if you may be exposed to STDs, use a condom.  - [place]. Choose someone to speak for you if you are not able. Please bring [place] a copy.  - Depression is common in our stressful world.If you're feeling down or losing interest in things you normally enjoy, please come in for a visit.

## 2022-01-25 NOTE — Assessment & Plan Note (Signed)
Previously well controlled Continue Synthroid at current dose  Recheck TSH and adjust Synthroid as indicated   

## 2022-01-25 NOTE — Assessment & Plan Note (Signed)
Worsened. Increase zoloft to 50 mg daily.

## 2022-01-25 NOTE — Assessment & Plan Note (Signed)
Continue prolia.  

## 2022-01-25 NOTE — Assessment & Plan Note (Signed)
Check labs 

## 2022-01-25 NOTE — Assessment & Plan Note (Signed)
Check level 

## 2022-01-25 NOTE — Assessment & Plan Note (Addendum)
Stable, but anxiety worsening. Increase zoloft to 50 mg daily.

## 2022-01-25 NOTE — Assessment & Plan Note (Signed)
Well controlled.  No changes to medicines. Continue Aspirin 81 mg daily, metoprolol 25 mg twice a day, lisinopril 10 mg daily.  Continue to work on eating a healthy diet and exercise.  Labs drawn today.

## 2022-02-12 ENCOUNTER — Ambulatory Visit: Payer: Medicare Other | Attending: Cardiology | Admitting: Cardiology

## 2022-02-12 ENCOUNTER — Encounter: Payer: Self-pay | Admitting: Cardiology

## 2022-02-12 VITALS — BP 152/72 | HR 68 | Ht 65.0 in | Wt 126.6 lb

## 2022-02-12 DIAGNOSIS — E782 Mixed hyperlipidemia: Secondary | ICD-10-CM | POA: Insufficient documentation

## 2022-02-12 DIAGNOSIS — I10 Essential (primary) hypertension: Secondary | ICD-10-CM | POA: Diagnosis not present

## 2022-02-12 DIAGNOSIS — R0609 Other forms of dyspnea: Secondary | ICD-10-CM | POA: Diagnosis not present

## 2022-02-12 DIAGNOSIS — R002 Palpitations: Secondary | ICD-10-CM | POA: Diagnosis not present

## 2022-02-12 NOTE — Progress Notes (Signed)
Cardiology Office Note:    Date:  02/12/2022   ID:  Karen Briggs, DOB Jun 11, 1934, MRN [id number]  PCP:  Rochel Brome, MD  Cardiologist:  Jenne Campus, MD    Referring MD: Lillard Anes,*   Chief Complaint  Patient presents with   Follow-up  Doing fine  History of Present Illness:    Karen Briggs is a 87 y.o. female comes today to muscle follow-up past medical history significant for palpitations successfully suppressed with very small dose of beta-blocker, essential hypertension, chronic kidney failure, dyslipidemia.  She is doing well denies of any chest pain tightness squeezing pressure burning chest, however, she complain of having some shortness of breath while walking.  She tried to walk on the regular basis  Past Medical History:  Diagnosis Date   BMI 26.0-26.9,adult 09/16/2019   Chronic kidney disease, stage 3a ([place]) 05/31/2019   Essential hypertension 10/17/2017   Iron deficiency anemia 05/31/2019   Kidney disease    Stage 3   Mixed hyperlipidemia 10/17/2017   Osteoporosis 12/07/2019   Other specified hypothyroidism 10/17/2017   Palpitations 10/17/2017   Plantar wart 09/15/2019   Vitamin D insufficiency 05/31/2019    Past Surgical History:  Procedure Laterality Date   CHOLECYSTECTOMY  2001    Current Medications: Current Meds  Medication Sig   acetaminophen (TYLENOL) 500 MG tablet Take 500 mg by mouth as needed for moderate pain.   aspirin EC 81 MG tablet Take 81 mg by mouth daily.   denosumab (PROLIA) 60 MG/ML SOSY injection Inject 60 mg into the skin every 6 (six) months.   levothyroxine (SYNTHROID) 75 MCG tablet TAKE 1 TABLET EVERY DAY BEFORE BREAKFAST (Patient taking differently: Take 75 mcg by mouth daily before breakfast.)   lisinopril (ZESTRIL) 10 MG tablet TAKE 1 TABLET EVERY DAY (Patient taking differently: Take 10 mg by mouth daily.)   metoprolol tartrate (LOPRESSOR) 25 MG tablet TAKE 1 TABLET TWICE DAILY (Patient taking differently: Take 25 mg by  mouth 2 (two) times daily.)   Multiple Vitamin (MULTIVITAMIN) capsule Take 1 capsule by mouth daily.   omeprazole (PRILOSEC) 20 MG capsule TAKE 1 CAPSULE EVERY DAY (Patient taking differently: Take 20 mg by mouth daily.)   sertraline (ZOLOFT) 50 MG tablet Take 1 tablet (50 mg total) by mouth daily.   simvastatin (ZOCOR) 40 MG tablet TAKE 1 TABLET EVERY DAY (Patient taking differently: Take 40 mg by mouth daily at 6 PM.)   SODIUM FLUORIDE 5000 PPM 1.1 % GEL dental gel Place 1 Application onto teeth at bedtime.     Allergies:   Levofloxacin, Alendronate, and Sulfa antibiotics   Social History   Socioeconomic History   Marital status: Widowed    Spouse name: Not on file   Number of children: 2   Years of education: Not on file   Highest education level: Not on file  Occupational History   Occupation: Retired  Tobacco Use   Smoking status: Never   Smokeless tobacco: Never  Substance and Sexual Activity   Alcohol use: Not Currently   Drug use: Never   Sexual activity: Not Currently  Other Topics Concern   Not on file  Social History Narrative   Not on file   Social Determinants of Health   Financial Resource Strain: Low Risk  (01/22/2022)   Overall Financial Resource Strain (CARDIA)    Difficulty of Paying Living Expenses: Not hard at all  Food Insecurity: No Food Insecurity (01/22/2022)   Hunger Vital Sign    Worried  About Running Out of Food in the Last Year: Never true    Ran Out of Food in the Last Year: Never true  Transportation Needs: No Transportation Needs (01/10/2021)   PRAPARE - Hydrologist (Medical): No    Lack of Transportation (Non-Medical): No  Physical Activity: Insufficiently Active (01/22/2022)   Exercise Vital Sign    Days of Exercise per Week: 4 days    Minutes of Exercise per Session: 20 min  Stress: Stress Concern Present (01/22/2022)   [place]    Feeling of  Stress : To some extent  Social Connections: Unknown (01/22/2022)   Social Connection and Isolation Panel [NHANES]    Frequency of Communication with Friends and Family: More than three times a week    Frequency of Social Gatherings with Friends and Family: Three times a week    Attends Religious Services: Not on file    Active Member of Clubs or Organizations: Not on file    Attends Archivist Meetings: Not on file    Marital Status: Not on file     Family History: The patient's family history includes Heart attack in her mother; Leukemia in her father. ROS:   Please see the history of present illness.    All 14 point review of systems negative except as described per history of present illness  EKGs/Labs/Other Studies Reviewed:      Recent Labs: 01/22/2022: ALT 21; BUN 14; Creatinine, Ser 1.09; Hemoglobin 12.0; Platelets 401; Potassium 5.3; Sodium 135; TSH 0.473  Recent Lipid Panel    Component Value Date/Time   CHOL 184 01/22/2022 1019   TRIG 78 01/22/2022 1019   HDL 89 01/22/2022 1019   CHOLHDL 2.1 01/22/2022 1019   LDLCALC 81 01/22/2022 1019    Physical Exam:    VS:  BP (!) 152/72 (BP Location: Left Arm, Patient Position: Sitting)   Pulse 68   Ht '5\' 5"'$  (1.651 m)   Wt 126 lb 9.6 oz (57.4 kg)   LMP  (LMP Unknown)   SpO2 99%   BMI 21.07 kg/m     Wt Readings from Last 3 Encounters:  02/12/22 126 lb 9.6 oz (57.4 kg)  01/22/22 121 lb (54.9 kg)  07/11/21 119 lb 12.8 oz (54.3 kg)     GEN:  Well nourished, well developed in no acute distress HEENT: Normal NECK: No JVD; No carotid bruits LYMPHATICS: No lymphadenopathy CARDIAC: RRR, no murmurs, no rubs, no gallops RESPIRATORY:  Clear to auscultation without rales, wheezing or rhonchi  ABDOMEN: Soft, non-tender, non-distended MUSCULOSKELETAL:  No edema; No deformity  SKIN: Warm and dry LOWER EXTREMITIES: no swelling NEUROLOGIC:  Alert and oriented x 3 PSYCHIATRIC:  Normal affect   ASSESSMENT:    1.  Essential hypertension   2. Palpitations   3. Mixed hyperlipidemia    PLAN:    In order of problems listed above:  Dyspnea on exertion: I will ask her to have an echocardiogram to assess left ventricle ejection fraction. Essential hypertension always elevated in the office having to check blood pressure at home always good sometimes even low below 093 systolic, therefore, I will not change any of her medication right now. Mixed dyslipidemia I did review her K PN which only dated from just few days ago with LDL of 81 HDL 89.  She is on statin which I will continue. Overall have tells me she is doing quite well for somebody her age.  We will get echocardiogram make sure ejection fraction is normal I will see her back in 1 year   Medication Adjustments/Labs and Tests Ordered: Current medicines are reviewed at length with the patient today.  Concerns regarding medicines are outlined above.  No orders of the defined types were placed in this encounter.  Medication changes: No orders of the defined types were placed in this encounter.   Signed, Park Liter, MD, [facility] 02/12/2022 3:43 PM    [place]

## 2022-02-12 NOTE — Patient Instructions (Signed)
Medication Instructions:  Your physician recommends that you continue on your current medications as directed. Please refer to the Current Medication list given to you today.  *If you need a refill on your cardiac medications before your next appointment, please call your pharmacy*   Lab Work: None If you have labs (blood work) drawn today and your tests are completely normal, you will receive your results only by: MyChart Message (if you have MyChart) OR A paper copy in the mail If you have any lab test that is abnormal or we need to change your treatment, we will call you to review the results.   Testing/Procedures: Your physician has requested that you have an echocardiogram. Echocardiography is a painless test that uses sound waves to create images of your heart. It provides your doctor with information about the size and shape of your heart and how well your heart's chambers and valves are working. This procedure takes approximately one hour. There are no restrictions for this procedure. Please do NOT wear cologne, perfume, aftershave, or lotions (deodorant is allowed). Please arrive 15 minutes prior to your appointment time.    Follow-Up: At [facility], you and your health needs are our priority.  As part of our continuing mission to provide you with exceptional heart care, we have created designated Provider Care Teams.  These Care Teams include your primary Cardiologist (physician) and Advanced Practice Providers (APPs -  Physician Assistants and Nurse Practitioners) who all work together to provide you with the care you need, when you need it.  We recommend signing up for the patient portal called "MyChart".  Sign up information is provided on this After Visit Summary.  MyChart is used to connect with patients for Virtual Visits (Telemedicine).  Patients are able to view lab/test results, encounter notes, upcoming appointments, etc.  Non-urgent messages can be sent to your  provider as well.   To learn more about what you can do with MyChart, go to [email or web address].[email or web address].    Your next appointment:   1 year(s)  The format for your next appointment:   In Person  Provider:   Robert Krasowski, MD    Other Instructions   Important Information About Sugar       

## 2022-02-12 NOTE — Addendum Note (Signed)
Addended by: Darrel Reach on: 02/12/2022 03:47 PM   Modules accepted: Orders

## 2022-02-21 ENCOUNTER — Ambulatory Visit (INDEPENDENT_AMBULATORY_CARE_PROVIDER_SITE_OTHER): Payer: Medicare Other | Admitting: Podiatry

## 2022-02-21 DIAGNOSIS — M79674 Pain in right toe(s): Secondary | ICD-10-CM

## 2022-02-21 DIAGNOSIS — L84 Corns and callosities: Secondary | ICD-10-CM

## 2022-02-21 DIAGNOSIS — M79675 Pain in left toe(s): Secondary | ICD-10-CM | POA: Diagnosis not present

## 2022-02-21 DIAGNOSIS — B351 Tinea unguium: Secondary | ICD-10-CM | POA: Diagnosis not present

## 2022-02-25 ENCOUNTER — Encounter: Payer: Self-pay | Admitting: Podiatry

## 2022-02-25 NOTE — Progress Notes (Signed)
  Subjective:  Patient ID: Karen Briggs, female    DOB: Oct 14, 1934,  MRN: [id number]  Kery Haltiwanger presents to clinic today for painful elongated mycotic toenails 1-5 bilaterally which are tender when wearing enclosed shoe gear. Pain is relieved with periodic professional debridement.  Chief Complaint  Patient presents with   Nail Problem    Routine foot care PCP-Kristen Cox PCP VST-1 month ago    New problem(s): None.   PCP is Cox, Kirsten, MD.  Allergies  Allergen Reactions   Levofloxacin Palpitations   Alendronate     diarrhea   Sulfa Antibiotics Nausea Only   Review of Systems: Negative except as noted in the HPI.  Objective: No changes noted in today's physical examination.  Chasey Dull is a pleasant 87 y.o. female WD, WN in NAD. AAO x 3.  Vascular Examination: CFT <3 seconds b/l. DP/PT pulses faintly palpable b/l. Skin temperature gradient warm to warm b/l. No pain with calf compression. No ischemia or gangrene. No cyanosis or clubbing noted b/l. Pedal hair absent.   Neurological Examination: Sensation grossly intact b/l with 10 gram monofilament. Vibratory sensation intact b/l.   Dermatological Examination: Pedal skin warm and supple b/l. Toenails 1-5 b/l thick, discolored, elongated with subungual debris and pain on dorsal palpation.  Hyperkeratotic lesion(s) medial IPJ of right great toe.  No erythema, no edema, no drainage, no fluctuance.  Musculoskeletal Examination: Muscle strength 5/5 to b/l LE. HAV with bunion bilaterally and hammertoes 2-5 b/l.  Radiographs: None  Assessment/Plan: 1. Pain due to onychomycosis of toenails of both feet     No orders of the defined types were placed in this encounter.   -Patient's family member present. All questions/concerns addressed on today's visit. -Examined patient. -Patient states she is not having pain with interdigital corn right great toe. Patient and daughter decline trimming of corn right great toe. They  did sign an ABN should corn become symptomatic. -Patient to continue soft, supportive shoe gear daily. -Toenails 1-5 b/l were debrided in length and girth with sterile nail nippers and dremel without iatrogenic bleeding.  -Patient/POA to call should there be question/concern in the interim.   Return in about 3 months (around 05/24/2022).  Marzetta Board, DPM

## 2022-02-27 ENCOUNTER — Ambulatory Visit: Payer: Medicare Other | Attending: Cardiology

## 2022-02-27 DIAGNOSIS — R002 Palpitations: Secondary | ICD-10-CM | POA: Diagnosis not present

## 2022-02-27 DIAGNOSIS — R0609 Other forms of dyspnea: Secondary | ICD-10-CM | POA: Diagnosis not present

## 2022-02-27 LAB — ECHOCARDIOGRAM COMPLETE
Area-P 1/2: 3.31 cm2
MV M vel: 5.65 m/s
MV Peak grad: 127.7 mmHg
S' Lateral: 3.1 cm

## 2022-03-01 ENCOUNTER — Telehealth: Payer: Self-pay | Admitting: Cardiology

## 2022-03-01 NOTE — Telephone Encounter (Signed)
Results reviewed with pt's daughter-per DPR as per Dr. Wendy Poet note.  Pt's daughter verbalized understanding and had no additional questions. Routed to PCP

## 2022-03-01 NOTE — Telephone Encounter (Signed)
Daughter was calling for results from her test. Please advise

## 2022-05-06 ENCOUNTER — Ambulatory Visit (INDEPENDENT_AMBULATORY_CARE_PROVIDER_SITE_OTHER): Payer: Medicare Other

## 2022-05-06 DIAGNOSIS — M810 Age-related osteoporosis without current pathological fracture: Secondary | ICD-10-CM

## 2022-05-06 MED ORDER — DENOSUMAB 60 MG/ML ~~LOC~~ SOSY
60.0000 mg | PREFILLED_SYRINGE | Freq: Once | SUBCUTANEOUS | Status: AC
  Administered 2022-05-06: 60 mg via SUBCUTANEOUS

## 2022-05-06 NOTE — Progress Notes (Signed)
Karen Briggs comes in for her prolia injection.  She has been tolerating the medication with no adverse reactions noted.

## 2022-05-06 NOTE — Addendum Note (Signed)
Addended by: Effie Shy on: 05/06/2022 10:37 AM   Modules accepted: Orders

## 2022-05-12 ENCOUNTER — Other Ambulatory Visit: Payer: Self-pay | Admitting: Family Medicine

## 2022-05-12 DIAGNOSIS — E782 Mixed hyperlipidemia: Secondary | ICD-10-CM

## 2022-05-12 DIAGNOSIS — I10 Essential (primary) hypertension: Secondary | ICD-10-CM

## 2022-05-12 DIAGNOSIS — E039 Hypothyroidism, unspecified: Secondary | ICD-10-CM

## 2022-05-18 ENCOUNTER — Other Ambulatory Visit: Payer: Self-pay | Admitting: Cardiology

## 2022-05-18 DIAGNOSIS — I10 Essential (primary) hypertension: Secondary | ICD-10-CM

## 2022-05-23 DEATH — deceased

## 2022-06-13 ENCOUNTER — Ambulatory Visit (INDEPENDENT_AMBULATORY_CARE_PROVIDER_SITE_OTHER): Payer: Medicare Other | Admitting: Podiatry

## 2022-06-13 ENCOUNTER — Ambulatory Visit: Payer: Medicare Other | Admitting: Podiatry

## 2022-06-13 ENCOUNTER — Encounter: Payer: Self-pay | Admitting: Podiatry

## 2022-06-13 DIAGNOSIS — M79674 Pain in right toe(s): Secondary | ICD-10-CM | POA: Diagnosis not present

## 2022-06-13 DIAGNOSIS — B351 Tinea unguium: Secondary | ICD-10-CM

## 2022-06-13 DIAGNOSIS — M79675 Pain in left toe(s): Secondary | ICD-10-CM | POA: Diagnosis not present

## 2022-06-13 NOTE — Progress Notes (Signed)
  Subjective:  Patient ID: Karen Briggs, female    DOB: 1934-10-24,  MRN: [id number]  Karen Briggs presents to clinic today for painful thick toenails that are difficult to trim. Pain interferes with ambulation. Aggravating factors include wearing enclosed shoe gear. Pain is relieved with periodic professional debridement.  Chief Complaint  Patient presents with   Diabetes   NAIL CARE    RFC TOOK BP MEDICINE    New problem(s): None.   Her daughter is present during today's visit.  PCP is Briggs, Kirsten, MD.  Allergies  Allergen Reactions   Levofloxacin Palpitations   Alendronate     diarrhea   Sulfa Antibiotics Nausea Only    Review of Systems: Negative except as noted in the HPI.  Objective:  There were no vitals filed for this visit.  Karen Briggs is a pleasant 88 y.o. female WD, WN in NAD. AAO x 3.  Vascular Examination: CFT <3 seconds b/l. DP/PT pulses faintly palpable b/l. Skin temperature gradient warm to warm b/l. No pain with calf compression. No ischemia or gangrene. No cyanosis or clubbing noted b/l. Pedal hair absent.   Neurological Examination: Sensation grossly intact b/l with 10 gram monofilament. Vibratory sensation intact b/l.   Dermatological Examination: Pedal skin warm and supple b/l. Toenails 1-5 b/l thick, discolored, elongated with subungual debris and pain on dorsal palpation.   Musculoskeletal Examination: Muscle strength 5/5 to b/l LE. HAV with bunion bilaterally and hammertoes 2-5 b/l.  Radiographs: None Assessment/Plan: 1. Pain due to onychomycosis of toenails of both feet     -Patient was evaluated and treated. All patient's and/or POA's questions/concerns answered on today's visit. -Continue supportive shoe gear daily. -Mycotic toenails 1-5 bilaterally were debrided in length and girth with sterile nail nippers and dremel without incident. -Patient/POA to call should there be question/concern in the interim.   Return in about 3 months  (around 09/11/2022).  Karen Briggs, DPM

## 2022-06-17 ENCOUNTER — Other Ambulatory Visit: Payer: Self-pay | Admitting: Family Medicine

## 2022-07-25 ENCOUNTER — Other Ambulatory Visit: Payer: Self-pay | Admitting: Family Medicine

## 2022-07-25 DIAGNOSIS — E039 Hypothyroidism, unspecified: Secondary | ICD-10-CM

## 2022-07-25 DIAGNOSIS — K2100 Gastro-esophageal reflux disease with esophagitis, without bleeding: Secondary | ICD-10-CM

## 2022-07-25 DIAGNOSIS — E782 Mixed hyperlipidemia: Secondary | ICD-10-CM

## 2022-07-25 DIAGNOSIS — I10 Essential (primary) hypertension: Secondary | ICD-10-CM

## 2022-07-28 NOTE — Progress Notes (Unsigned)
Subjective:  Patient ID: Karen Briggs, female    DOB: 02/10/1935  Age: 88 y.o. MRN: [id number]  No chief complaint on file.   HPI  Hypothyroidism: She is taking levothyroxine 75 mcg daily.  Diagnosed 20 years ago.    Hyperlipidemia: Takes Simvastatin 40 mg daily.  Eats low cholesterol diet and and maintains exercise regimen.  Tolerating medicine.   GERD: omeprazoe 20 mg daily.   Osteoporosis: on prolia.   GAD/Depression: on zoloft 50 mg daily.   HYPERTENSION: on metoprolol 25 mg twice daily and lisinopril 10 mg daily      01/25/2022    3:17 PM 07/11/2021    8:23 AM 07/11/2021    8:22 AM 01/10/2021    1:42 PM 12/28/2020    9:21 AM  Depression screen PHQ 2/9  Decreased Interest 0 0 0 0 3  Down, Depressed, Hopeless 0 0 0 0 3  PHQ - 2 Score 0 0 0 0 6  Altered sleeping 0 0  0 0  Tired, decreased energy 0 0  0 1  Change in appetite 0 0  0 0  Feeling bad or failure about yourself  0 0  0 0  Trouble concentrating 0 0  0 0  Moving slowly or fidgety/restless 0 0  0 0  Suicidal thoughts 0 0  0 0  PHQ-9 Score 0 0  0 7  Difficult doing work/chores Not difficult at all Not difficult at all  Not difficult at all Somewhat difficult         09/16/2019    9:26 AM 12/28/2020    9:11 AM 01/10/2021    1:48 PM 07/11/2021    8:22 AM  Fall Risk  Falls in the past year? 1 0 0 0  Was there an injury with Fall? 0 0 0 0  Fall Risk Category Calculator 1 0 0 0  Fall Risk Category (Retired) Low Low Low Low  (RETIRED) Patient Fall Risk Level Moderate fall risk Moderate fall risk Moderate fall risk Low fall risk  Patient at Risk for Falls Due to  History of fall(s)    Fall risk Follow up Falls evaluation completed Education provided Falls prevention discussed       Review of Systems  Constitutional:  Negative for chills, fatigue and fever.  HENT:  Negative for congestion, ear pain and sore throat.   Respiratory:  Negative for cough and shortness of breath.   Cardiovascular:  Negative for  chest pain.  Gastrointestinal:  Negative for abdominal pain, constipation, diarrhea, nausea and vomiting.  Genitourinary:  Negative for dysuria and urgency.  Musculoskeletal:  Negative for arthralgias and myalgias.  Skin:  Negative for rash.  Neurological:  Negative for dizziness and headaches.  Psychiatric/Behavioral:  Negative for dysphoric mood. The patient is not nervous/anxious.     Current Outpatient Medications on File Prior to Visit  Medication Sig Dispense Refill   acetaminophen (TYLENOL) 500 MG tablet Take 500 mg by mouth as needed for moderate pain.     aspirin EC 81 MG tablet Take 81 mg by mouth daily.     denosumab (PROLIA) 60 MG/ML SOSY injection Inject 60 mg into the skin every 6 (six) months.     levothyroxine (SYNTHROID) 75 MCG tablet TAKE 1 TABLET EVERY DAY BEFORE BREAKFAST 90 tablet 0   lisinopril (ZESTRIL) 10 MG tablet Take 1 tablet (10 mg total) by mouth daily. 90 tablet 2   metoprolol tartrate (LOPRESSOR) 25 MG tablet TAKE 1 TABLET TWICE DAILY 180  tablet 0   Multiple Vitamin (MULTIVITAMIN) capsule Take 1 capsule by mouth daily.     omeprazole (PRILOSEC) 20 MG capsule TAKE 1 CAPSULE EVERY DAY 90 capsule 0   sertraline (ZOLOFT) 50 MG tablet TAKE 1 TABLET EVERY DAY 90 tablet 1   simvastatin (ZOCOR) 40 MG tablet TAKE 1 TABLET EVERY DAY 90 tablet 0   SODIUM FLUORIDE 5000 PPM 1.1 % GEL dental gel Place 1 Application onto teeth at bedtime.     No current facility-administered medications on file prior to visit.   Past Medical History:  Diagnosis Date   BMI 26.0-26.9,adult 09/16/2019   Chronic kidney disease, stage 3a ([facility]) 05/31/2019   Essential hypertension 10/17/2017   Iron deficiency anemia 05/31/2019   Kidney disease    Stage 3   Mixed hyperlipidemia 10/17/2017   Osteoporosis 12/07/2019   Other specified hypothyroidism 10/17/2017   Palpitations 10/17/2017   Plantar wart 09/15/2019   Vitamin D insufficiency 05/31/2019   Past Surgical History:  Procedure Laterality Date    CHOLECYSTECTOMY  2001    Family History  Problem Relation Age of Onset   Heart attack Mother    Leukemia Father    Social History   Socioeconomic History   Marital status: Widowed    Spouse name: Not on file   Number of children: 2   Years of education: Not on file   Highest education level: Not on file  Occupational History   Occupation: Retired  Tobacco Use   Smoking status: Never   Smokeless tobacco: Never  Substance and Sexual Activity   Alcohol use: Not Currently   Drug use: Never   Sexual activity: Not Currently  Other Topics Concern   Not on file  Social History Narrative   Not on file   Social Determinants of Health   Financial Resource Strain: Low Risk  (01/22/2022)   Overall Financial Resource Strain (CARDIA)    Difficulty of Paying Living Expenses: Not hard at all  Food Insecurity: No Food Insecurity (01/22/2022)   Hunger Vital Sign    Worried About Running Out of Food in the Last Year: Never true    Ran Out of Food in the Last Year: Never true  Transportation Needs: No Transportation Needs (01/10/2021)   PRAPARE - Administrator, Civil ServiceTransportation    Lack of Transportation (Medical): No    Lack of Transportation (Non-Medical): No  Physical Activity: Insufficiently Active (01/22/2022)   Exercise Vital Sign    Days of Exercise per Week: 4 days    Minutes of Exercise per Session: 20 min  Stress: Stress Concern Present (01/22/2022)   [employer] - Occupational Stress Questionnaire    Feeling of Stress : To some extent  Social Connections: Unknown (01/22/2022)   Social Connection and Isolation Panel [NHANES]    Frequency of Communication with Friends and Family: More than three times a week    Frequency of Social Gatherings with Friends and Family: Three times a week    Attends Religious Services: Not on file    Active Member of Clubs or Organizations: Not on file    Attends Club or Organization Meetings: Not on file    Marital Status: Not on file     Objective:  LMP  (LMP Unknown)      02/12/2022    3:14 PM 01/22/2022    9:09 AM 07/11/2021    8:18 AM  BP/Weight  Systolic BP 152 132 128  Diastolic BP 72 56 72  Wt. (Lbs) 126.6 121  119.8  BMI 21.07 kg/m2 20.14 kg/m2 19.94 kg/m2    Physical Exam Vitals reviewed.  Constitutional:      Appearance: Normal appearance. She is normal weight.  Neck:     Vascular: No carotid bruit.  Cardiovascular:     Rate and Rhythm: Normal rate and regular rhythm.     Heart sounds: Normal heart sounds.  Pulmonary:     Effort: Pulmonary effort is normal. No respiratory distress.     Breath sounds: Normal breath sounds.  Abdominal:     General: Abdomen is flat. Bowel sounds are normal.     Palpations: Abdomen is soft.     Tenderness: There is no abdominal tenderness.  Neurological:     Mental Status: She is alert and oriented to person, place, and time.  Psychiatric:        Mood and Affect: Mood normal.        Behavior: Behavior normal.     Diabetic Foot Exam - Simple   No data filed      Lab Results  Component Value Date   WBC 7.2 01/22/2022   HGB 12.0 01/22/2022   HCT 34.7 01/22/2022   PLT 401 01/22/2022   GLUCOSE 91 01/22/2022   CHOL 184 01/22/2022   TRIG 78 01/22/2022   HDL 89 01/22/2022   LDLCALC 81 01/22/2022   ALT 21 01/22/2022   AST 29 01/22/2022   NA 135 01/22/2022   K 5.3 (H) 01/22/2022   CL 97 01/22/2022   CREATININE 1.09 (H) 01/22/2022   BUN 14 01/22/2022   CO2 22 01/22/2022   TSH 0.473 01/22/2022      Assessment & Plan:    There are no diagnoses linked to this encounter.   No orders of the defined types were placed in this encounter.   No orders of the defined types were placed in this encounter.    Follow-up: No follow-ups on file.   I,Demitrious Mccannon,acting as a Neurosurgeon for Blane Ohara, MD.,have documented all relevant documentation on the behalf of Blane Ohara, MD,as directed by  Blane Ohara, MD while in the presence of Blane Ohara, MD.   An  After Visit Summary was printed and given to the patient.  Blane Ohara, MD [facility] [phone]

## 2022-07-29 ENCOUNTER — Ambulatory Visit (INDEPENDENT_AMBULATORY_CARE_PROVIDER_SITE_OTHER): Payer: Medicare Other | Admitting: Family Medicine

## 2022-07-29 ENCOUNTER — Encounter: Payer: Self-pay | Admitting: Family Medicine

## 2022-07-29 VITALS — BP 136/72 | HR 82 | Temp 97.6°F | Ht 65.0 in | Wt 127.0 lb

## 2022-07-29 DIAGNOSIS — F411 Generalized anxiety disorder: Secondary | ICD-10-CM | POA: Diagnosis not present

## 2022-07-29 DIAGNOSIS — F330 Major depressive disorder, recurrent, mild: Secondary | ICD-10-CM | POA: Diagnosis not present

## 2022-07-29 DIAGNOSIS — N1831 Chronic kidney disease, stage 3a: Secondary | ICD-10-CM | POA: Diagnosis not present

## 2022-07-29 DIAGNOSIS — E038 Other specified hypothyroidism: Secondary | ICD-10-CM | POA: Diagnosis not present

## 2022-07-29 DIAGNOSIS — E782 Mixed hyperlipidemia: Secondary | ICD-10-CM

## 2022-07-29 DIAGNOSIS — K2100 Gastro-esophageal reflux disease with esophagitis, without bleeding: Secondary | ICD-10-CM

## 2022-07-29 DIAGNOSIS — R413 Other amnesia: Secondary | ICD-10-CM | POA: Diagnosis not present

## 2022-07-29 DIAGNOSIS — M810 Age-related osteoporosis without current pathological fracture: Secondary | ICD-10-CM

## 2022-07-29 DIAGNOSIS — D508 Other iron deficiency anemias: Secondary | ICD-10-CM

## 2022-07-29 DIAGNOSIS — I129 Hypertensive chronic kidney disease with stage 1 through stage 4 chronic kidney disease, or unspecified chronic kidney disease: Secondary | ICD-10-CM | POA: Diagnosis not present

## 2022-07-29 DIAGNOSIS — E559 Vitamin D deficiency, unspecified: Secondary | ICD-10-CM

## 2022-07-29 MED ORDER — SERTRALINE HCL 100 MG PO TABS
100.0000 mg | ORAL_TABLET | Freq: Every day | ORAL | 1 refills | Status: DC
Start: 2022-07-29 — End: 2022-11-15

## 2022-07-29 NOTE — Assessment & Plan Note (Signed)
Increase zoloft 100 mg once daily.  °

## 2022-07-29 NOTE — Assessment & Plan Note (Signed)
The current medical regimen is effective;  continue present plan and medications. Continue  metoprolol 25 mg twice daily and lisinopril 10 mg daily. Check labs.

## 2022-07-29 NOTE — Assessment & Plan Note (Signed)
stable °

## 2022-07-29 NOTE — Patient Instructions (Addendum)
Increasing dose of zoloft to 100 mg daily may cause nausea and diarrhea.  May take zoloft with all other medications except levothyroxine which should be taken alone, 30 minutes prior to eating. Consider starting Aricept (typically taken at bedtime) and Namenda.

## 2022-07-29 NOTE — Assessment & Plan Note (Signed)
Order DEXA 

## 2022-07-29 NOTE — Assessment & Plan Note (Signed)
The current medical regimen is effective;  continue present plan and medications. Continue omeprazole 20 mg daily.   

## 2022-07-29 NOTE — Assessment & Plan Note (Signed)
Will increase zoloft to 100 mg daily  

## 2022-07-29 NOTE — Assessment & Plan Note (Signed)
Well controlled.  No changes to medicines. Continue simvastatin 40 mg daily  Continue to work on eating a healthy diet and exercise.  Labs drawn today.   

## 2022-07-29 NOTE — Assessment & Plan Note (Signed)
Check labs 

## 2022-07-29 NOTE — Assessment & Plan Note (Signed)
Previously well controlled Continue Synthroid at current dose  Recheck TSH and adjust Synthroid as indicated   

## 2022-07-29 NOTE — Assessment & Plan Note (Signed)
Discussed Mri of Brain and starting aricept and/or namenda. Daughter wishes to discuss further with patient.

## 2022-07-30 LAB — CBC WITH DIFFERENTIAL/PLATELET
Basophils Absolute: 0.1 10*3/uL (ref 0.0–0.2)
Basos: 1 %
EOS (ABSOLUTE): 0.1 10*3/uL (ref 0.0–0.4)
Eos: 2 %
Hematocrit: 34.9 % (ref 34.0–46.6)
Hemoglobin: 11.5 g/dL (ref 11.1–15.9)
Immature Grans (Abs): 0 10*3/uL (ref 0.0–0.1)
Immature Granulocytes: 0 %
Lymphocytes Absolute: 1.7 10*3/uL (ref 0.7–3.1)
Lymphs: 26 %
MCH: 31 pg (ref 26.6–33.0)
MCHC: 33 g/dL (ref 31.5–35.7)
MCV: 94 fL (ref 79–97)
Monocytes Absolute: 0.7 10*3/uL (ref 0.1–0.9)
Monocytes: 11 %
Neutrophils Absolute: 3.9 10*3/uL (ref 1.4–7.0)
Neutrophils: 60 %
Platelets: 348 10*3/uL (ref 150–450)
RBC: 3.71 x10E6/uL — ABNORMAL LOW (ref 3.77–5.28)
RDW: 13.4 % (ref 11.7–15.4)
WBC: 6.5 10*3/uL (ref 3.4–10.8)

## 2022-07-30 LAB — COMPREHENSIVE METABOLIC PANEL
ALT: 24 IU/L (ref 0–32)
AST: 35 IU/L (ref 0–40)
Albumin/Globulin Ratio: 1.8 (ref 1.2–2.2)
Albumin: 4.7 g/dL (ref 3.7–4.7)
Alkaline Phosphatase: 57 IU/L (ref 44–121)
BUN/Creatinine Ratio: 14 (ref 12–28)
BUN: 16 mg/dL (ref 8–27)
Bilirubin Total: 0.4 mg/dL (ref 0.0–1.2)
CO2: 20 mmol/L (ref 20–29)
Calcium: 10.4 mg/dL — ABNORMAL HIGH (ref 8.7–10.3)
Chloride: 101 mmol/L (ref 96–106)
Creatinine, Ser: 1.17 mg/dL — ABNORMAL HIGH (ref 0.57–1.00)
Globulin, Total: 2.6 g/dL (ref 1.5–4.5)
Glucose: 90 mg/dL (ref 70–99)
Potassium: 5 mmol/L (ref 3.5–5.2)
Sodium: 138 mmol/L (ref 134–144)
Total Protein: 7.3 g/dL (ref 6.0–8.5)
eGFR: 46 mL/min/{1.73_m2} — ABNORMAL LOW (ref >59)

## 2022-07-30 LAB — LIPID PANEL
Chol/HDL Ratio: 2 ratio (ref 0.0–4.4)
Cholesterol, Total: 170 mg/dL (ref 100–199)
HDL: 86 mg/dL (ref >39)
LDL Chol Calc (NIH): 70 mg/dL (ref 0–99)
Triglycerides: 74 mg/dL (ref 0–149)
VLDL Cholesterol Cal: 14 mg/dL (ref 5–40)

## 2022-07-30 LAB — TSH: TSH: 1.87 u[IU]/mL (ref 0.450–4.500)

## 2022-07-30 LAB — CARDIOVASCULAR RISK ASSESSMENT

## 2022-08-29 ENCOUNTER — Ambulatory Visit (INDEPENDENT_AMBULATORY_CARE_PROVIDER_SITE_OTHER): Payer: Medicare Other | Admitting: Podiatry

## 2022-08-29 DIAGNOSIS — L84 Corns and callosities: Secondary | ICD-10-CM | POA: Diagnosis not present

## 2022-08-29 DIAGNOSIS — B351 Tinea unguium: Secondary | ICD-10-CM | POA: Diagnosis not present

## 2022-08-29 DIAGNOSIS — I739 Peripheral vascular disease, unspecified: Secondary | ICD-10-CM | POA: Diagnosis not present

## 2022-08-29 NOTE — Progress Notes (Signed)
       Subjective:  Patient ID: Karen Briggs, female    DOB: [birth year],  MRN: [id number]  Karen Briggs presents to clinic today for:  Chief Complaint  Patient presents with   routine foot care   . Patient notes nails are thick and elongated, causing pain in shoe gear when ambulating.    PCP is Cox, Kirsten, MD.  Allergies  Allergen Reactions   Levofloxacin Palpitations   Alendronate     diarrhea   Sulfa Antibiotics Nausea Only    Review of Systems: Negative except as noted in the HPI.  Objective:  [name] is a pleasant 88 y.o. female in NAD. AAO x 3.  Vascular Examination: Patient has palpable DP pulse, absent PT pulse bilateral.  Delayed capillary refill bilateral toes.  Sparse digital hair bilateral.  Proximal to distal cooling WNL bilateral.    Dermatological Examination: Interspaces are clear with no open lesions noted bilateral.  Nails are 3-59mm thick, with yellowish/brown discoloration, subungual debris and distal onycholysis x10.  There is pain with compression of nails x10.  There is a hyperkeratotic lesion noted on the medial aspect of the right second toe at the PIP joint.  No ulceration is seen.  Neurological Examination: Protective sensation intact with Semmes-Weinstein 10 gram monofilament b/l LE. Vibratory sensation intact b/l LE.  Musculoskeletal Examination: Muscle strength 5/5 to all LE muscle groups b/l.  There is lateral deviation of the right hallux.  This is placing pressure on the right second toe causing lateral angulation of the toe at the PIPJ level which is causing the distal medial aspect of the head of the proximal phalanx to be prominent and rubbing against the hallux.  Patient qualifies for at-risk foot care because of PVD, pain and nails.  Assessment/Plan: onychomycosis with pain. 2.  Interdigital corn right first interspace. 3.  PVD.  The mycotic nails were sharply debrided with sterile nail nippers and a power debriding bur.  The  hyperkeratotic lesion was shaved uneventfully for with a sterile #312 blade.  She was dispensed a size small Silipos toe cap to wear on the second toe.  She would most likely do best by placing lambswool as far proximally into the interspaces possible to decrease any pressure on the corn.  Recommend follow-up in 3 months for at risk footcare.   Clerance Lav, DPM, FACFAS [facility]     [address] [place], [place] [address]                Office [phone]  Fax [phone]

## 2022-09-25 ENCOUNTER — Encounter: Payer: Self-pay | Admitting: Family Medicine

## 2022-09-25 DIAGNOSIS — M810 Age-related osteoporosis without current pathological fracture: Secondary | ICD-10-CM | POA: Diagnosis not present

## 2022-09-25 LAB — HM DEXA SCAN

## 2022-09-30 ENCOUNTER — Telehealth: Payer: Self-pay

## 2022-09-30 NOTE — Telephone Encounter (Signed)
   Patient: Karen Briggs  DOB: January 25, 1935  MRN: [id number]  Evenity benefits verified by [employer] on: 09/26/22 Coverage Available: Buy & Bill Prior authorization NOT REQUIRED Deductible: $240 of $240 met OOP Cost: 0  Benefit Details: : Benefits subject to a $240.00 deductible ($240.00 met) and 20.0%% co-insurance for the administration and cost of Evenity.  For the Secondary MD Purchase access option, this is a Medicare Supplement Plan F and it covers the Medicare Part B deductible, co-insurance and 100% of the excess charges.   Jacklynn Bue, LPN

## 2022-10-06 ENCOUNTER — Other Ambulatory Visit: Payer: Self-pay | Admitting: Family Medicine

## 2022-10-06 DIAGNOSIS — I10 Essential (primary) hypertension: Secondary | ICD-10-CM

## 2022-10-06 DIAGNOSIS — E782 Mixed hyperlipidemia: Secondary | ICD-10-CM

## 2022-10-06 DIAGNOSIS — K2100 Gastro-esophageal reflux disease with esophagitis, without bleeding: Secondary | ICD-10-CM

## 2022-10-06 DIAGNOSIS — E039 Hypothyroidism, unspecified: Secondary | ICD-10-CM

## 2022-10-16 ENCOUNTER — Ambulatory Visit (INDEPENDENT_AMBULATORY_CARE_PROVIDER_SITE_OTHER): Payer: Medicare Other | Admitting: Podiatry

## 2022-10-16 DIAGNOSIS — L84 Corns and callosities: Secondary | ICD-10-CM | POA: Diagnosis not present

## 2022-10-16 DIAGNOSIS — M2041 Other hammer toe(s) (acquired), right foot: Secondary | ICD-10-CM

## 2022-10-16 DIAGNOSIS — M2011 Hallux valgus (acquired), right foot: Secondary | ICD-10-CM

## 2022-10-16 NOTE — Progress Notes (Signed)
    Subjective:  Patient ID: Karen Briggs, female    DOB: 1934/07/11,  MRN: [id number]  Karen Briggs presents to clinic today for: No recurrence of a medial right second toe corn.  She states that it returned pretty quickly after her last appointment when it was shaved at her scheduled routine footcare appointment.Marland Kitchen  PCP is Cox, Kirsten, MD.  Allergies  Allergen Reactions   Levofloxacin Palpitations   Alendronate     diarrhea   Sulfa Antibiotics Nausea Only   Review of Systems: Negative except as noted in the HPI.  Objective: No changes noted in today's physical examination. There were no vitals filed for this visit.  Karen Briggs is a pleasant 88 y.o. female in NAD. AAO x 3.  Vascular Examination: Capillary refill time is 3-5 seconds to toes bilateral. Palpable pedal pulses b/l LE. Digital hair present b/l. No pedal edema b/l. Skin temperature gradient WNL b/l. No varicosities b/l. No cyanosis or clubbing noted b/l.   Dermatological Examination: Pedal skin with normal turgor, texture and tone b/l. No open wounds. No interdigital macerations b/l.   Hyperkeratotic lesion present, with pain on palpation, located medial aspect right second toe PIP joint.  Neurological Examination: Protective sensation intact with Semmes-Weinstein 10 gram monofilament b/l LE. Vibratory sensation intact b/l LE.  Musculoskeletal Examination: Muscle strength 5/5 to all LE muscle groups b/l.  There is lateral angulation of the right hallux placing pressure onto the second toe.  The head of the proximal phalanx of the second toe is prominent in the first interspace at the location of the corn.  The middle and distal phalanges are angulated laterally secondary to pressure from the hallux  Assessment/Plan: 1. Hammertoe of right foot   2. Corns   3. Hallux valgus, right    The hyperkeratotic lesions were sharply debrided/shaved with sterile #312 blade.  Discussed with the patient what is causing the  corns/calluses and reviewed treatment options today, including shaving the the painful lesion(s), off-loading techniques and pads, custom orthotics / shoe modifications.  If conservative treatment fails to provide enough relief, or if the frequency of recurrence increases, then will recommend surgical intervention to help prevent the painful skin lesions from recurring.   If the patient returns more frequently than the allotted 9 weeks for any routine footcare services, which would include callus or corn shaving, this would not be a covered service no matter how much pain she is then.  Medicare does not make any exceptions on this rule.  She would have to sign an ABN and pay out-of-pocket for the service.  She was dispensed some toe spacers today, which were modified to remain proximal to the lesion so as to not apply more pressure on the area but create space between the toes, in hopes that this will help alleviate discomfort between appointments.  She can wear this at all times weightbearing, but remove for sleeping and bathing  Return for as scheduled for rfc .   Clerance Lav, DPM, FACFAS [facility]     [address] [place], [place] [address]                Office [phone]  Fax [phone]

## 2022-10-31 ENCOUNTER — Telehealth: Payer: Self-pay

## 2022-10-31 NOTE — Telephone Encounter (Signed)
Carollee Herter called with concerns about starting the evenity on her Mom.  I attempted to call her back to answer any questions but Dr. Sedalia Muta said the we can refer her to [facility] for evaluation of her osteoporosis.  I left a message for Carollee Herter to call us back and give the approval for the referral.

## 2022-11-06 NOTE — Telephone Encounter (Signed)
   Patient: Karen Briggs  DOB: 1934/10/08  MRN: [id number]  Prolia benefits verified by [employer] on: 10/07/22 Coverage Available: Buy & Bill Prior authorization NOT REQUIRED Deductible: $240 of $240 met OOP Cost: 0  Benefit Details: Primary-: Prolia and administration will be subject to a $240.00 deductible ($240.00 met) and 20.0% coinsurance.  For the Secondary MD Purchase access option, this is a Medicare Supplement Plan F and it covers the Medicare Part B deductible, co-insurance and 100% of the excess charges.  Patient agreed to proceed with ordering and will be scheduled once it has arrived in the office.    Jacklynn Bue, LPN

## 2022-11-06 NOTE — Telephone Encounter (Signed)
Karen Briggs called back stating that she wanted to hold off on the Evenity and continue with Prolia.

## 2022-11-12 DIAGNOSIS — H524 Presbyopia: Secondary | ICD-10-CM | POA: Diagnosis not present

## 2022-11-12 DIAGNOSIS — Z961 Presence of intraocular lens: Secondary | ICD-10-CM | POA: Diagnosis not present

## 2022-11-12 DIAGNOSIS — Z9841 Cataract extraction status, right eye: Secondary | ICD-10-CM | POA: Diagnosis not present

## 2022-11-12 DIAGNOSIS — H5231 Anisometropia: Secondary | ICD-10-CM | POA: Diagnosis not present

## 2022-11-12 DIAGNOSIS — H52223 Regular astigmatism, bilateral: Secondary | ICD-10-CM | POA: Diagnosis not present

## 2022-11-12 DIAGNOSIS — Z9842 Cataract extraction status, left eye: Secondary | ICD-10-CM | POA: Diagnosis not present

## 2022-11-14 NOTE — Assessment & Plan Note (Signed)
The current medical regimen is effective;  continue present plan and medications. Continue  metoprolol 25 mg twice daily and lisinopril 10 mg daily. Check labs.

## 2022-11-14 NOTE — Progress Notes (Unsigned)
Subjective:  Patient ID: Karen Briggs, female    DOB: 1934-12-06  Age: 88 y.o. MRN: [id number]  Chief Complaint  Patient presents with   Medical Management of Chronic Issues    HPI Hypothyroidism: She is taking levothyroxine 75 mcg daily.  Diagnosed 20 years ago.    Hyperlipidemia: Takes Simvastatin 40 mg daily. Eats low cholesterol diet and and maintains exercise regimen.  Tolerating medicine.   GERD: omeprazole 20 mg daily.   Osteoporosis: on prolia. Due for injection today.  GAD/Depression: on zoloft 50 mg daily. Daughter questions if medication should be increased due to patient being consistently anxious. Patient has OCD and checks door locks multiple times.  HYPERTENSION: on metoprolol 25 mg twice daily and lisinopril 10 mg daily       11/15/2022    9:27 AM 07/29/2022    8:57 AM 07/29/2022    8:54 AM 01/25/2022    3:17 PM 07/11/2021    8:23 AM  Depression screen PHQ 2/9  Decreased Interest 0 0 0 0 0  Down, Depressed, Hopeless 0 0 0 0 0  PHQ - 2 Score 0 0 0 0 0  Altered sleeping 0 0  0 0  Tired, decreased energy 0 0  0 0  Change in appetite 0 0  0 0  Feeling bad or failure about yourself  0 0  0 0  Trouble concentrating 0 0  0 0  Moving slowly or fidgety/restless 0 0  0 0  Suicidal thoughts 0 0  0 0  PHQ-9 Score 0 0  0 0  Difficult doing work/chores  Not difficult at all  Not difficult at all Not difficult at all        11/15/2022    9:27 AM  Fall Risk   Falls in the past year? 0  Number falls in past yr: 0  Injury with Fall? 0  Risk for fall due to : No Fall Risks  Follow up Falls evaluation completed    Patient Care Team: Blane Ohara, MD as PCP - General (Internal Medicine) Earvin Hansen, [facility] (Inactive) as Pharmacist (Pharmacist) Georgeanna Lea, MD as Consulting Physician (Cardiology)   Review of Systems  Constitutional:  Negative for chills, fatigue and fever.  HENT:  Negative for congestion, ear pain, rhinorrhea and sore throat.   Respiratory:   Negative for cough and shortness of breath.   Cardiovascular:  Negative for chest pain.  Gastrointestinal:  Negative for abdominal pain, constipation, diarrhea, nausea and vomiting.  Genitourinary:  Negative for dysuria and urgency.  Musculoskeletal:  Positive for back pain. Negative for myalgias.  Neurological:  Negative for dizziness, weakness, light-headedness and headaches.  Psychiatric/Behavioral:  Negative for dysphoric mood. The patient is not nervous/anxious.     Current Outpatient Medications on File Prior to Visit  Medication Sig Dispense Refill   acetaminophen (TYLENOL) 500 MG tablet Take 500 mg by mouth as needed for moderate pain.     aspirin EC 81 MG tablet Take 81 mg by mouth daily.     denosumab (PROLIA) 60 MG/ML SOSY injection Inject 60 mg into the skin every 6 (six) months.     levothyroxine (SYNTHROID) 75 MCG tablet TAKE 1 TABLET EVERY DAY BEFORE BREAKFAST 90 tablet 0   lisinopril (ZESTRIL) 10 MG tablet Take 1 tablet (10 mg total) by mouth daily. 90 tablet 2   metoprolol tartrate (LOPRESSOR) 25 MG tablet TAKE 1 TABLET TWICE DAILY 180 tablet 0   Multiple Vitamin (MULTIVITAMIN) capsule Take 1  capsule by mouth daily.     omeprazole (PRILOSEC) 20 MG capsule TAKE 1 CAPSULE EVERY DAY 90 capsule 0   simvastatin (ZOCOR) 40 MG tablet TAKE 1 TABLET EVERY DAY 90 tablet 0   SODIUM FLUORIDE 5000 PPM 1.1 % GEL dental gel Place 1 Application onto teeth at bedtime.     No current facility-administered medications on file prior to visit.   Past Medical History:  Diagnosis Date   BMI 26.0-26.9,adult 09/16/2019   Chronic kidney disease, stage 3a (HCC) 05/31/2019   Essential hypertension 10/17/2017   Iron deficiency anemia 05/31/2019   Kidney disease    Stage 3   Mixed hyperlipidemia 10/17/2017   Osteoporosis 12/07/2019   Other specified hypothyroidism 10/17/2017   Palpitations 10/17/2017   Plantar wart 09/15/2019   Vitamin D insufficiency 05/31/2019   Past Surgical History:  Procedure  Laterality Date   CHOLECYSTECTOMY  2001    Family History  Problem Relation Age of Onset   Heart attack Mother    Leukemia Father    Social History   Socioeconomic History   Marital status: Widowed    Spouse name: Not on file   Number of children: 2   Years of education: Not on file   Highest education level: Not on file  Occupational History   Occupation: Retired  Tobacco Use   Smoking status: Never   Smokeless tobacco: Never  Substance and Sexual Activity   Alcohol use: Not Currently   Drug use: Never   Sexual activity: Not Currently  Other Topics Concern   Not on file  Social History Narrative   Not on file   Social Determinants of Health   Financial Resource Strain: Low Risk  (01/22/2022)   Overall Financial Resource Strain (CARDIA)    Difficulty of Paying Living Expenses: Not hard at all  Food Insecurity: No Food Insecurity (01/22/2022)   Hunger Vital Sign    Worried About Running Out of Food in the Last Year: Never true    Ran Out of Food in the Last Year: Never true  Transportation Needs: No Transportation Needs (01/10/2021)   PRAPARE - Administrator, Civil Service (Medical): No    Lack of Transportation (Non-Medical): No  Physical Activity: Insufficiently Active (01/22/2022)   Exercise Vital Sign    Days of Exercise per Week: 4 days    Minutes of Exercise per Session: 20 min  Stress: No Stress Concern Present (11/15/2022)   [employer] of Occupational Health - Occupational Stress Questionnaire    Feeling of Stress : Only a little  Social Connections: Socially Isolated (11/15/2022)   Social Connection and Isolation Panel [NHANES]    Frequency of Communication with Friends and Family: Three times a week    Frequency of Social Gatherings with Friends and Family: Three times a week    Attends Religious Services: Never    Active Member of Clubs or Organizations: No    Attends Banker Meetings: Never    Marital Status: Widowed     Objective:  BP 130/62   Pulse 66   Temp (!) 97.2 F (36.2 C)   Ht 5\' 5"  (1.651 m)   Wt 121 lb (54.9 kg)   LMP  (LMP Unknown)   SpO2 94%   BMI 20.14 kg/m      11/15/2022    9:27 AM 07/29/2022    8:53 AM 02/12/2022    3:14 PM  BP/Weight  Systolic BP 130 136 152  Diastolic BP 62 72  72  Wt. (Lbs) 121 127 126.6  BMI 20.14 kg/m2 21.13 kg/m2 21.07 kg/m2    Physical Exam Vitals reviewed.  Constitutional:      Appearance: Normal appearance. She is normal weight.  HENT:     Right Ear: Tympanic membrane, ear canal and external ear normal. There is impacted cerumen.     Left Ear: Tympanic membrane, ear canal and external ear normal. There is impacted cerumen.  Neck:     Vascular: No carotid bruit.  Cardiovascular:     Rate and Rhythm: Normal rate and regular rhythm.     Heart sounds: Normal heart sounds.  Pulmonary:     Effort: Pulmonary effort is normal. No respiratory distress.     Breath sounds: Normal breath sounds.  Abdominal:     General: Abdomen is flat. Bowel sounds are normal.     Palpations: Abdomen is soft.     Tenderness: There is no abdominal tenderness.  Neurological:     Mental Status: She is alert and oriented to person, place, and time.  Psychiatric:        Mood and Affect: Mood normal.        Behavior: Behavior normal.     Diabetic Foot Exam - Simple   No data filed      Lab Results  Component Value Date   WBC 7.5 11/15/2022   HGB 11.1 11/15/2022   HCT 35.0 11/15/2022   PLT 393 11/15/2022   GLUCOSE 78 11/15/2022   CHOL 167 11/15/2022   TRIG 132 11/15/2022   HDL 66 11/15/2022   LDLCALC 78 11/15/2022   ALT 12 11/15/2022   AST 26 11/15/2022   NA 140 11/15/2022   K 5.5 (H) 11/15/2022   CL 105 11/15/2022   CREATININE 1.05 (H) 11/15/2022   BUN 19 11/15/2022   CO2 19 (L) 11/15/2022   TSH 1.870 07/29/2022      Assessment & Plan:    Other specified hypothyroidism Assessment & Plan: Previously well controlled Continue Synthroid at  current dose      Hypertensive renal disease, benign, stage 1-4 or unspecified chronic kidney disease Assessment & Plan: The current medical regimen is effective;  continue present plan and medications. Continue  metoprolol 25 mg twice daily and lisinopril 10 mg daily. Check labs.   Orders: -     CBC with Differential/Platelet -     Comprehensive metabolic panel  Mixed hyperlipidemia Assessment & Plan: Well controlled.  No changes to medicines. Continue simvastatin 40 mg daily.  Continue to work on eating a healthy diet and exercise.  Labs drawn today.    Orders: -     Lipid panel  GAD (generalized anxiety disorder) Assessment & Plan: Increase zoloft to 100 mg daily  Orders: -     Sertraline HCl; Take 2 tablets (100 mg total) by mouth daily.  Dispense: 90 tablet; Refill: 1  Major depressive disorder, recurrent episode, mild with melancholic features (HCC) Assessment & Plan: Increase zoloft 100 mg daily.   Orders: -     Sertraline HCl; Take 2 tablets (100 mg total) by mouth daily.  Dispense: 90 tablet; Refill: 1  Age-related osteoporosis without current pathological fracture Assessment & Plan: The current medical regimen is effective;  continue present plan and medications.  Prolia  Orders: -     Denosumab  Impacted cerumen of both ears -     EAR CERUMEN REMOVAL  Chronic kidney disease, stage 3a (HCC) Assessment & Plan: Stable.   Gastroesophageal reflux disease  with esophagitis without hemorrhage Assessment & Plan: The current medical regimen is effective;  continue present plan and medications. Continue omeprazole 20 mg daily       Meds ordered this encounter  Medications   denosumab (PROLIA) injection 60 mg    Order Specific Question:   Patient is enrolled in REMS program for this medication and I have provided a copy of the Prolia Medication Guide and Patient Brochure.    Answer:   No    Order Specific Question:   I have reviewed with the patient  the information in the Prolia Medication Guide and Patient Counseling Chart including the serious risks of Prolia and symptoms of each risk.    Answer:   Yes    Order Specific Question:   I have advised the patient to seek medical attention if they have signs or symptoms of any of the serious risks.    Answer:   Yes   sertraline (ZOLOFT) 50 MG tablet    Sig: Take 2 tablets (100 mg total) by mouth daily.    Dispense:  90 tablet    Refill:  1    Orders Placed This Encounter  Procedures   CBC with Differential/Platelet   Comprehensive metabolic panel   Lipid panel   EAR CERUMEN REMOVAL     Follow-up: Return in about 3 months (around 02/15/2023) for chronic, fasting.   I,Marla I Leal-Borjas,acting as a scribe for Blane Ohara, MD.,have documented all relevant documentation on the behalf of Blane Ohara, MD,as directed by  Blane Ohara, MD while in the presence of Blane Ohara, MD.   An After Visit Summary was printed and given to the patient.  Blane Ohara, MD [facility] [phone]

## 2022-11-14 NOTE — Assessment & Plan Note (Addendum)
Previously well controlled Continue Synthroid at current dose  

## 2022-11-14 NOTE — Assessment & Plan Note (Signed)
Well controlled.  No changes to medicines. Continue simvastatin 40 mg daily  Continue to work on eating a healthy diet and exercise.  Labs drawn today.   

## 2022-11-15 ENCOUNTER — Ambulatory Visit (INDEPENDENT_AMBULATORY_CARE_PROVIDER_SITE_OTHER): Payer: Medicare Other | Admitting: Family Medicine

## 2022-11-15 ENCOUNTER — Encounter: Payer: Self-pay | Admitting: Family Medicine

## 2022-11-15 VITALS — BP 130/62 | HR 66 | Temp 97.2°F | Ht 65.0 in | Wt 121.0 lb

## 2022-11-15 DIAGNOSIS — F411 Generalized anxiety disorder: Secondary | ICD-10-CM | POA: Diagnosis not present

## 2022-11-15 DIAGNOSIS — K2100 Gastro-esophageal reflux disease with esophagitis, without bleeding: Secondary | ICD-10-CM

## 2022-11-15 DIAGNOSIS — E782 Mixed hyperlipidemia: Secondary | ICD-10-CM

## 2022-11-15 DIAGNOSIS — F330 Major depressive disorder, recurrent, mild: Secondary | ICD-10-CM

## 2022-11-15 DIAGNOSIS — E038 Other specified hypothyroidism: Secondary | ICD-10-CM

## 2022-11-15 DIAGNOSIS — H6123 Impacted cerumen, bilateral: Secondary | ICD-10-CM

## 2022-11-15 DIAGNOSIS — I129 Hypertensive chronic kidney disease with stage 1 through stage 4 chronic kidney disease, or unspecified chronic kidney disease: Secondary | ICD-10-CM

## 2022-11-15 DIAGNOSIS — M810 Age-related osteoporosis without current pathological fracture: Secondary | ICD-10-CM | POA: Diagnosis not present

## 2022-11-15 DIAGNOSIS — N1831 Chronic kidney disease, stage 3a: Secondary | ICD-10-CM

## 2022-11-15 MED ORDER — DENOSUMAB 60 MG/ML ~~LOC~~ SOSY
60.0000 mg | PREFILLED_SYRINGE | Freq: Once | SUBCUTANEOUS | Status: AC
Start: 2022-11-15 — End: 2022-11-15
  Administered 2022-11-15: 60 mg via SUBCUTANEOUS

## 2022-11-15 MED ORDER — SERTRALINE HCL 50 MG PO TABS
100.0000 mg | ORAL_TABLET | Freq: Every day | ORAL | 1 refills | Status: DC
Start: 2022-11-15 — End: 2023-01-27

## 2022-11-15 NOTE — Assessment & Plan Note (Signed)
Increase zoloft to 100mg daily

## 2022-11-16 NOTE — Assessment & Plan Note (Signed)
The current medical regimen is effective;  continue present plan and medications (Prolia)

## 2022-11-16 NOTE — Assessment & Plan Note (Addendum)
Increase zoloft 100 mg daily

## 2022-11-18 ENCOUNTER — Other Ambulatory Visit: Payer: Self-pay

## 2022-11-18 DIAGNOSIS — H6123 Impacted cerumen, bilateral: Secondary | ICD-10-CM | POA: Insufficient documentation

## 2022-11-18 MED ORDER — LOSARTAN POTASSIUM 25 MG PO TABS: 25.0000 mg | ORAL_TABLET | Freq: Every day | ORAL | 1 refills | Status: DC

## 2022-11-18 NOTE — Assessment & Plan Note (Signed)
Stable

## 2022-11-18 NOTE — Assessment & Plan Note (Signed)
The current medical regimen is effective;  continue present plan and medications. Continue omeprazole 20 mg daily.    

## 2022-11-28 ENCOUNTER — Ambulatory Visit: Payer: Medicare Other | Admitting: Podiatry

## 2022-12-04 ENCOUNTER — Ambulatory Visit (INDEPENDENT_AMBULATORY_CARE_PROVIDER_SITE_OTHER): Payer: Medicare Other | Admitting: Podiatry

## 2022-12-04 DIAGNOSIS — I739 Peripheral vascular disease, unspecified: Secondary | ICD-10-CM

## 2022-12-04 DIAGNOSIS — B351 Tinea unguium: Secondary | ICD-10-CM

## 2022-12-04 NOTE — Progress Notes (Signed)
       Subjective:  Patient ID: Karen Briggs, female    DOB: 11/05/1934,  MRN: [id number]  Karen Briggs presents to clinic today for:  Chief Complaint  Patient presents with   [facility]    RFC   Patient notes nails are thick, discolored and elongated, causing pain in shoes when ambulating.    PCP is Cox, Kirsten, MD. date last seen was 11/15/2022.  Allergies  Allergen Reactions   Levofloxacin Palpitations   Alendronate     diarrhea   Sulfa Antibiotics Nausea Only    Review of Systems: Negative except as noted in the HPI.  Objective:  There were no vitals filed for this visit.  [name] is a pleasant 88 y.o. female in NAD. AAO x 3.  Vascular Examination: Patient has palpable DP pulse, absent PT pulse bilateral.  Delayed capillary refill bilateral toes.  Sparse digital hair bilateral.  Proximal to distal cooling WNL bilateral.    Dermatological Examination: Interspaces are clear with no open lesions noted bilateral.  Nails are 3-34mm thick, with yellowish/brown discoloration, subungual debris and distal onycholysis x10.  There is pain with compression of nails x10.    Patient qualifies for at-risk foot care because of PVD.  Assessment/Plan: 1. Dermatophytosis of nail   2. PVD (peripheral vascular disease) (HCC)    Mycotic nails x10 were sharply debrided with sterile nail nippers and power debriding burr to decrease bulk and length.  Return in about 3 months (around 03/06/2023) for RFC.   Clerance Lav, DPM, FACFAS [facility]     [address] [place], [place] [address]                Office [phone]  Fax [phone]

## 2022-12-19 ENCOUNTER — Other Ambulatory Visit: Payer: Self-pay | Admitting: Family Medicine

## 2022-12-19 DIAGNOSIS — E782 Mixed hyperlipidemia: Secondary | ICD-10-CM

## 2022-12-19 DIAGNOSIS — K2100 Gastro-esophageal reflux disease with esophagitis, without bleeding: Secondary | ICD-10-CM

## 2022-12-19 DIAGNOSIS — I10 Essential (primary) hypertension: Secondary | ICD-10-CM

## 2022-12-19 DIAGNOSIS — E039 Hypothyroidism, unspecified: Secondary | ICD-10-CM

## 2023-01-14 DIAGNOSIS — Z23 Encounter for immunization: Secondary | ICD-10-CM | POA: Diagnosis not present

## 2023-01-27 ENCOUNTER — Other Ambulatory Visit: Payer: Self-pay | Admitting: Family Medicine

## 2023-01-27 DIAGNOSIS — F411 Generalized anxiety disorder: Secondary | ICD-10-CM

## 2023-01-27 DIAGNOSIS — F330 Major depressive disorder, recurrent, mild: Secondary | ICD-10-CM

## 2023-02-03 DIAGNOSIS — H6123 Impacted cerumen, bilateral: Secondary | ICD-10-CM | POA: Diagnosis not present

## 2023-02-20 ENCOUNTER — Ambulatory Visit: Payer: Medicare Other

## 2023-02-20 VITALS — BP 122/58 | HR 62 | Resp 16 | Ht 65.0 in | Wt 123.0 lb

## 2023-02-20 DIAGNOSIS — Z0000 Encounter for general adult medical examination without abnormal findings: Secondary | ICD-10-CM | POA: Diagnosis not present

## 2023-02-20 NOTE — Progress Notes (Signed)
Subjective:   Karen Briggs is a 88 y.o. female who presents for Medicare Annual (Subsequent) preventive examination.  This wellness visit is conducted by a nurse.  The patient's medications were reviewed and reconciled since the patient's last visit.  History details were provided by the patient.  The history appears to be reliable.    Medical History: Patient history and Family history was reviewed  Medications, Allergies, and preventative health maintenance was reviewed and updated.   Visit Complete: In person   Cardiac Risk Factors include: advanced age (>32men, >45 women)     Objective:    Today's Vitals   02/20/23 1359  BP: (!) 122/58  Pulse: 62  Resp: 16  SpO2: 98%  Weight: 123 lb (55.8 kg)  Height: 5\' 5"  (1.651 m)  PainSc: 0-No pain   Body mass index is 20.47 kg/m.     01/22/2022   10:00 AM 01/10/2021    1:47 PM  Advanced Directives  Does Patient Have a Medical Advance Directive? Yes Yes  Type of Advance Directive Healthcare Power of Attorney   Does patient want to make changes to medical advance directive? Yes (ED - Information included in AVS) No - Patient declined  Copy of Healthcare Power of Attorney in Chart? Yes - validated most recent copy scanned in chart (See row information)     Current Medications (verified) Outpatient Encounter Medications as of 02/20/2023  Medication Sig   acetaminophen (TYLENOL) 500 MG tablet Take 500 mg by mouth as needed for moderate pain.   aspirin EC 81 MG tablet Take 81 mg by mouth daily.   denosumab (PROLIA) 60 MG/ML SOSY injection Inject 60 mg into the skin every 6 (six) months.   levothyroxine (SYNTHROID) 75 MCG tablet TAKE 1 TABLET EVERY DAY BEFORE BREAKFAST   losartan (COZAAR) 25 MG tablet Take 1 tablet (25 mg total) by mouth daily.   metoprolol tartrate (LOPRESSOR) 25 MG tablet TAKE 1 TABLET TWICE DAILY   Multiple Vitamin (MULTIVITAMIN) capsule Take 1 capsule by mouth daily.   omeprazole (PRILOSEC) 20 MG capsule TAKE  1 CAPSULE EVERY DAY   sertraline (ZOLOFT) 50 MG tablet TAKE 2 TABLETS (100 MG) BY MOUTH DAILY   simvastatin (ZOCOR) 40 MG tablet TAKE 1 TABLET EVERY DAY   SODIUM FLUORIDE 5000 PPM 1.1 % GEL dental gel Place 1 Application onto teeth at bedtime.   No facility-administered encounter medications on file as of 02/20/2023.    Allergies (verified) Levofloxacin, Alendronate, and Sulfa antibiotics   History: Past Medical History:  Diagnosis Date   BMI 26.0-26.9,adult 09/16/2019   Chronic kidney disease, stage 3a (HCC) 05/31/2019   Essential hypertension 10/17/2017   Iron deficiency anemia 05/31/2019   Kidney disease    Stage 3   Mixed hyperlipidemia 10/17/2017   Osteoporosis 12/07/2019   Other specified hypothyroidism 10/17/2017   Palpitations 10/17/2017   Plantar wart 09/15/2019   Vitamin D insufficiency 05/31/2019   Past Surgical History:  Procedure Laterality Date   CHOLECYSTECTOMY  2001   Family History  Problem Relation Age of Onset   Heart attack Mother    Leukemia Father    Social History   Socioeconomic History   Marital status: Widowed    Spouse name: Not on file   Number of children: 2   Years of education: Not on file   Highest education level: Not on file  Occupational History   Occupation: Retired  Tobacco Use   Smoking status: Never   Smokeless tobacco: Never  Vaping Use  Vaping status: Never Used  Substance and Sexual Activity   Alcohol use: Not Currently   Drug use: Never   Sexual activity: Not Currently  Other Topics Concern   Not on file  Social History Narrative   Daughter lives next door and packs medications for her   Social Determinants of Health   Financial Resource Strain: Low Risk  (02/20/2023)   Overall Financial Resource Strain (CARDIA)    Difficulty of Paying Living Expenses: Not hard at all  Food Insecurity: No Food Insecurity (02/20/2023)   Hunger Vital Sign    Worried About Running Out of Food in the Last Year: Never true    Ran Out of  Food in the Last Year: Never true  Transportation Needs: No Transportation Needs (02/20/2023)   PRAPARE - Administrator, Civil Service (Medical): No    Lack of Transportation (Non-Medical): No  Physical Activity: Insufficiently Active (02/20/2023)   Exercise Vital Sign    Days of Exercise per Week: 4 days    Minutes of Exercise per Session: 20 min  Stress: No Stress Concern Present (02/20/2023)   [employer] of Occupational Health - Occupational Stress Questionnaire    Feeling of Stress : Only a little  Social Connections: Socially Isolated (02/20/2023)   Social Connection and Isolation Panel [NHANES]    Frequency of Communication with Friends and Family: Three times a week    Frequency of Social Gatherings with Friends and Family: Three times a week    Attends Religious Services: Never    Active Member of Clubs or Organizations: No    Attends Banker Meetings: Never    Marital Status: Widowed    Tobacco Counseling Counseling given: Not Answered   Clinical Intake:  Pre-visit preparation completed: Yes  Pain : No/denies pain Pain Score: 0-No pain     BMI - recorded: 20.47 Nutritional Status: BMI of 19-24  Normal Nutritional Risks: None Diabetes: No  How often do you need to have someone help you when you read instructions, pamphlets, or other written materials from your doctor or pharmacy?: 2 - Rarely  Interpreter Needed?: No      Activities of Daily Living    02/20/2023    2:04 PM  In your present state of health, do you have any difficulty performing the following activities:  Hearing? 1  Vision? 1  Difficulty concentrating or making decisions? 1  Walking or climbing stairs? 0  Dressing or bathing? 0  Doing errands, shopping? 1  Preparing Food and eating ? N  Using the Toilet? N  In the past six months, have you accidently leaked urine? Y  Do you have problems with loss of bowel control? N  Managing your Medications? Y   Managing your Finances? Y  Housekeeping or managing your Housekeeping? N    Patient Care Team: Blane Ohara, MD as PCP - General (Internal Medicine) Georgeanna Lea, MD as Consulting Physician (Cardiology)  Indicate any recent Medical Services you may have received from other than [facility] providers in the past year (date may be approximate).     Assessment:   This is a routine wellness examination for Karen Briggs.  Hearing/Vision screen No results found.   Goals Addressed   None    Depression Screen    02/20/2023    2:03 PM 11/15/2022    9:27 AM 07/29/2022    8:57 AM 07/29/2022    8:54 AM 01/25/2022    3:17 PM 07/11/2021    8:23 AM  07/11/2021    8:22 AM  PHQ 2/9 Scores  PHQ - 2 Score 0 0 0 0 0 0 0  PHQ- 9 Score  0 0  0 0     Fall Risk    02/20/2023    2:03 PM 11/15/2022    9:27 AM 07/29/2022    8:54 AM 07/11/2021    8:22 AM 01/10/2021    1:48 PM  Fall Risk   Falls in the past year? 0 0 0 0 0  Number falls in past yr: 0 0 0 0 0  Injury with Fall? 0 0 0 0 0  Risk for fall due to : No Fall Risks No Fall Risks No Fall Risks    Follow up Falls evaluation completed;Education provided Falls evaluation completed Falls evaluation completed  Falls prevention discussed    MEDICARE RISK AT HOME: Medicare Risk at Home Any stairs in or around the home?: Yes (does not use them) If so, are there any without handrails?: No Home free of loose throw rugs in walkways, pet beds, electrical cords, etc?: Yes Adequate lighting in your home to reduce risk of falls?: Yes Life alert?: No Use of a cane, walker or w/c?: No Grab bars in the bathroom?: No Shower chair or bench in shower?: No Elevated toilet seat or a handicapped toilet?: No  TIMED UP AND GO:  Was the test performed?  Yes  Length of time to ambulate 10 feet: 5 sec Gait slow and steady without use of assistive device    Cognitive Function:    01/22/2022   10:18 AM  MMSE - Mini Mental State Exam  Orientation to time 3   Orientation to Place 4  Registration 3  Attention/ Calculation 2  Recall 0  Language- name 2 objects 2  Language- repeat 1  Language- follow 3 step command 3  Language- read & follow direction 1  Write a sentence 1  Copy design 0  Total score 20        01/10/2021    1:51 PM  6CIT Screen  What Year? 0 points  What month? 0 points  What time? 0 points  Count back from 20 0 points  Months in reverse 0 points  Repeat phrase 0 points  Total Score 0 points    Immunizations Immunization History  Administered Date(s) Administered   Fluad Quad(high Dose 65+) 02/01/2020, 02/20/2021, 01/22/2022   Influenza, High Dose Seasonal PF 02/11/2018   Influenza-Unspecified 01/14/2023   Moderna Covid-19 Vaccine Bivalent Booster 11yrs & up 01/09/2022   Moderna Sars-Covid-2 Vaccination 05/14/2019, 06/09/2019, 02/20/2020   PNEUMOCOCCAL CONJUGATE-20 07/11/2021   Tdap 03/15/2021   Unspecified SARS-COV-2 Vaccination 01/14/2023   Zoster Recombinant(Shingrix) 08/02/2021, 10/08/2021    TDAP status: Up to date  Flu Vaccine status: Up to date  Pneumococcal vaccine status: Up to date  Covid-19 vaccine status: Completed vaccines  Qualifies for Shingles Vaccine? Yes   Zostavax completed Yes   Shingrix Completed?: Yes  Screening Tests Health Maintenance  Topic Date Due   Medicare Annual Wellness (AWV)  01/23/2023   COVID-19 Vaccine (6 - 2023-24 season) 03/11/2023   DTaP/Tdap/Td (2 - Td or Tdap) 03/16/2031   Pneumonia Vaccine 75+ Years old  Completed   INFLUENZA VACCINE  Completed   DEXA SCAN  Completed   Zoster Vaccines- Shingrix  Completed   HPV VACCINES  Aged Out    Health Maintenance  Health Maintenance Due  Topic Date Due   Medicare Annual Wellness (AWV)  01/23/2023  Colorectal cancer screening: No longer required.   Mammogram status: No longer required due to age.  Bone Density status: Completed 2024. Results reflect: Bone density results: OSTEOPOROSIS. Repeat every 2  years.  Lung Cancer Screening: (Low Dose CT Chest recommended if Age 64-80 years, 20 pack-year currently smoking OR have quit w/in 15years.) does not qualify.    Additional Screening:  Vision Screening: Recommended annual ophthalmology exams for early detection of glaucoma and other disorders of the eye. Is the patient up to date with their annual eye exam?  Yes  Who is the provider or what is the name of the office in which the patient attends annual eye exams? Dr Dan Humphreys  Dental Screening: Recommended annual dental exams for proper oral hygiene   Community Resource Referral / Chronic Care Management: CRR required this visit?  No   CCM required this visit?  No     Plan:    Counseling was provided today regarding the following topics: healthy eating habits, home safety, vitamin and mineral supplementation (calcium and Vit D), regular exercise, breast self-exam, tobacco avoidance, limitation of alcohol intake, use of seat belts, firearm safety, and fall prevention.  Annual recommendations include: influenza vaccine, dental cleanings, and eye exams.  I have personally reviewed and noted the following in the patient's chart:   Medical and social history Use of alcohol, tobacco or illicit drugs  Current medications and supplements including opioid prescriptions. Patient is not currently taking opioid prescriptions. Functional ability and status Nutritional status Physical activity Advanced directives List of other physicians Hospitalizations, surgeries, and ER visits in previous 12 months Vitals Screenings to include cognitive, depression, and falls Referrals and appointments  In addition, I have reviewed and discussed with patient certain preventive protocols, quality metrics, and best practice recommendations. A written personalized care plan for preventive services as well as general preventive health recommendations were provided to patient.     Jacklynn Bue,  LPN   [id number]

## 2023-02-20 NOTE — Patient Instructions (Signed)
Karen Briggs , Thank you for taking time to come for your Medicare Wellness Visit. I appreciate your ongoing commitment to your health goals. Please review the following plan we discussed and let me know if I can assist you in the future.    This is a list of the screening recommended for you and due dates:  Health Maintenance  Topic Date Due   COVID-19 Vaccine (6 - 2023-24 season) 03/11/2023   Medicare Annual Wellness Visit  02/20/2024   DTaP/Tdap/Td vaccine (2 - Td or Tdap) 03/16/2031   Pneumonia Vaccine  Completed   Flu Shot  Completed   DEXA scan (bone density measurement)  Completed   Zoster (Shingles) Vaccine  Completed   HPV Vaccine  Aged Out    Advanced directives: Please bring a copy for your medical record     Preventive Care 65 Years and Older, Female Preventive care refers to lifestyle choices and visits with your health care provider that can promote health and wellness. What does preventive care include? A yearly physical exam. This is also called an annual well check. Dental exams once or twice a year. Routine eye exams. Ask your health care provider how often you should have your eyes checked. Personal lifestyle choices, including: Daily care of your teeth and gums. Regular physical activity. Eating a healthy diet. Avoiding tobacco and drug use. Limiting alcohol use. Practicing safe sex. Taking low-dose aspirin every day. Taking vitamin and mineral supplements as recommended by your health care provider. What happens during an annual well check? The services and screenings done by your health care provider during your annual well check will depend on your age, overall health, lifestyle risk factors, and family history of disease. Counseling  Your health care provider may ask you questions about your: Alcohol use. Tobacco use. Drug use. Emotional well-being. Home and relationship well-being. Sexual activity. Eating habits. History of falls. Memory and  ability to understand (cognition). Work and work Astronomer. Reproductive health. Screening  You may have the following tests or measurements: Height, weight, and BMI. Blood pressure. Lipid and cholesterol levels. These may be checked every 5 years, or more frequently if you are over 67 years old. Skin check. Lung cancer screening. You may have this screening every year starting at age 48 if you have a 30-pack-year history of smoking and currently smoke or have quit within the past 15 years. Fecal occult blood test (FOBT) of the stool. You may have this test every year starting at age 7. Flexible sigmoidoscopy or colonoscopy. You may have a sigmoidoscopy every 5 years or a colonoscopy every 10 years starting at age 68. Hepatitis C blood test. Hepatitis B blood test. Sexually transmitted disease (STD) testing. Diabetes screening. This is done by checking your blood sugar (glucose) after you have not eaten for a while (fasting). You may have this done every 1-3 years. Bone density scan. This is done to screen for osteoporosis. You may have this done starting at age 73. Mammogram. This may be done every 1-2 years. Talk to your health care provider about how often you should have regular mammograms. Talk with your health care provider about your test results, treatment options, and if necessary, the need for more tests. Vaccines  Your health care provider may recommend certain vaccines, such as: Influenza vaccine. This is recommended every year. Tetanus, diphtheria, and acellular pertussis (Tdap, Td) vaccine. You may need a Td booster every 10 years. Zoster vaccine. You may need this after age 31. Pneumococcal 13-valent  conjugate (PCV13) vaccine. One dose is recommended after age 42. Pneumococcal polysaccharide (PPSV23) vaccine. One dose is recommended after age 39. Talk to your health care provider about which screenings and vaccines you need and how often you need them. This information is  not intended to replace advice given to you by your health care provider. Make sure you discuss any questions you have with your health care provider. Document Released: 05/05/2015 Document Revised: 12/27/2015 Document Reviewed: 02/07/2015 Elsevier Interactive Patient Education  2017 [employer].  Fall Prevention in the Home Falls can cause injuries. They can happen to people of all ages. There are many things you can do to make your home safe and to help prevent falls. What can I do on the outside of my home? Regularly fix the edges of walkways and driveways and fix any cracks. Remove anything that might make you trip as you walk through a door, such as a raised step or threshold. Trim any bushes or trees on the path to your home. Use bright outdoor lighting. Clear any walking paths of anything that might make someone trip, such as rocks or tools. Regularly check to see if handrails are loose or broken. Make sure that both sides of any steps have handrails. Any raised decks and porches should have guardrails on the edges. Have any leaves, snow, or ice cleared regularly. Use sand or salt on walking paths during winter. Clean up any spills in your garage right away. This includes oil or grease spills. What can I do in the bathroom? Use night lights. Install grab bars by the toilet and in the tub and shower. Do not use towel bars as grab bars. Use non-skid mats or decals in the tub or shower. If you need to sit down in the shower, use a plastic, non-slip stool. Keep the floor dry. Clean up any water that spills on the floor as soon as it happens. Remove soap buildup in the tub or shower regularly. Attach bath mats securely with double-sided non-slip rug tape. Do not have throw rugs and other things on the floor that can make you trip. What can I do in the bedroom? Use night lights. Make sure that you have a light by your bed that is easy to reach. Do not use any sheets or blankets that  are too big for your bed. They should not hang down onto the floor. Have a firm chair that has side arms. You can use this for support while you get dressed. Do not have throw rugs and other things on the floor that can make you trip. What can I do in the kitchen? Clean up any spills right away. Avoid walking on wet floors. Keep items that you use a lot in easy-to-reach places. If you need to reach something above you, use a strong step stool that has a grab bar. Keep electrical cords out of the way. Do not use floor polish or wax that makes floors slippery. If you must use wax, use non-skid floor wax. Do not have throw rugs and other things on the floor that can make you trip. What can I do with my stairs? Do not leave any items on the stairs. Make sure that there are handrails on both sides of the stairs and use them. Fix handrails that are broken or loose. Make sure that handrails are as long as the stairways. Check any carpeting to make sure that it is firmly attached to the stairs. Fix any carpet that  is loose or worn. Avoid having throw rugs at the top or bottom of the stairs. If you do have throw rugs, attach them to the floor with carpet tape. Make sure that you have a light switch at the top of the stairs and the bottom of the stairs. If you do not have them, ask someone to add them for you. What else can I do to help prevent falls? Wear shoes that: Do not have high heels. Have rubber bottoms. Are comfortable and fit you well. Are closed at the toe. Do not wear sandals. If you use a stepladder: Make sure that it is fully opened. Do not climb a closed stepladder. Make sure that both sides of the stepladder are locked into place. Ask someone to hold it for you, if possible. Clearly mark and make sure that you can see: Any grab bars or handrails. First and last steps. Where the edge of each step is. Use tools that help you move around (mobility aids) if they are needed. These  include: Canes. Walkers. Scooters. Crutches. Turn on the lights when you go into a dark area. Replace any light bulbs as soon as they burn out. Set up your furniture so you have a clear path. Avoid moving your furniture around. If any of your floors are uneven, fix them. If there are any pets around you, be aware of where they are. Review your medicines with your doctor. Some medicines can make you feel dizzy. This can increase your chance of falling. Ask your doctor what other things that you can do to help prevent falls. This information is not intended to replace advice given to you by your health care provider. Make sure you discuss any questions you have with your health care provider. Document Released: 02/02/2009 Document Revised: 09/14/2015 Document Reviewed: 05/13/2014 Elsevier Interactive Patient Education  2017 [employer].

## 2023-02-23 NOTE — Assessment & Plan Note (Signed)
Well controlled.  No changes to medicines. Continue simvastatin 40 mg daily  Continue to work on eating a healthy diet and exercise.  Labs drawn today.   

## 2023-02-23 NOTE — Assessment & Plan Note (Signed)
Previously well controlled Continue Synthroid at current dose  

## 2023-02-23 NOTE — Progress Notes (Unsigned)
Subjective:  Patient ID: Karen Briggs, female    DOB: [birth year]  Age: 88 y.o. MRN: [id number]  No chief complaint on file.   HPI   Hypothyroidism: She is taking levothyroxine 75 mcg daily.  Diagnosed 20 years ago.    Hyperlipidemia: Takes Simvastatin 40 mg daily. Eats low cholesterol diet and and maintains exercise regimen.  Tolerating medicine.   GERD: omeprazole 20 mg daily.   Osteoporosis: on prolia. Due for injection today.  GAD/Depression: on zoloft 50 mg daily. Daughter questions if medication should be increased due to patient being consistently anxious. Patient has OCD and checks door locks multiple times.  HYPERTENSION: on metoprolol 25 mg twice daily and losartan 25 mg daily      02/20/2023    2:03 PM 11/15/2022    9:27 AM 07/29/2022    8:57 AM 07/29/2022    8:54 AM 01/25/2022    3:17 PM  Depression screen PHQ 2/9  Decreased Interest 0 0 0 0 0  Down, Depressed, Hopeless 0 0 0 0 0  PHQ - 2 Score 0 0 0 0 0  Altered sleeping  0 0  0  Tired, decreased energy  0 0  0  Change in appetite  0 0  0  Feeling bad or failure about yourself   0 0  0  Trouble concentrating  0 0  0  Moving slowly or fidgety/restless  0 0  0  Suicidal thoughts  0 0  0  PHQ-9 Score  0 0  0  Difficult doing work/chores   Not difficult at all  Not difficult at all        02/20/2023    2:03 PM  Fall Risk   Falls in the past year? 0  Number falls in past yr: 0  Injury with Fall? 0  Risk for fall due to : No Fall Risks  Follow up Falls evaluation completed;Education provided    Patient Care Team: Blane Ohara, MD as PCP - General (Internal Medicine) Georgeanna Lea, MD as Consulting Physician (Cardiology)   Review of Systems  Current Outpatient Medications on File Prior to Visit  Medication Sig Dispense Refill   acetaminophen (TYLENOL) 500 MG tablet Take 500 mg by mouth as needed for moderate pain.     aspirin EC 81 MG tablet Take 81 mg by mouth daily.     denosumab (PROLIA) 60  MG/ML SOSY injection Inject 60 mg into the skin every 6 (six) months.     levothyroxine (SYNTHROID) 75 MCG tablet TAKE 1 TABLET EVERY DAY BEFORE BREAKFAST 90 tablet 3   losartan (COZAAR) 25 MG tablet Take 1 tablet (25 mg total) by mouth daily. 90 tablet 1   metoprolol tartrate (LOPRESSOR) 25 MG tablet TAKE 1 TABLET TWICE DAILY 180 tablet 3   Multiple Vitamin (MULTIVITAMIN) capsule Take 1 capsule by mouth daily.     omeprazole (PRILOSEC) 20 MG capsule TAKE 1 CAPSULE EVERY DAY 90 capsule 3   sertraline (ZOLOFT) 50 MG tablet TAKE 2 TABLETS (100 MG) BY MOUTH DAILY 180 tablet 0   simvastatin (ZOCOR) 40 MG tablet TAKE 1 TABLET EVERY DAY 90 tablet 3   SODIUM FLUORIDE 5000 PPM 1.1 % GEL dental gel Place 1 Application onto teeth at bedtime.     No current facility-administered medications on file prior to visit.   Past Medical History:  Diagnosis Date   BMI 26.0-26.9,adult 09/16/2019   Chronic kidney disease, stage 3a (HCC) 05/31/2019   Essential hypertension 10/17/2017  Iron deficiency anemia 05/31/2019   Kidney disease    Stage 3   Mixed hyperlipidemia 10/17/2017   Osteoporosis 12/07/2019   Other specified hypothyroidism 10/17/2017   Palpitations 10/17/2017   Plantar wart 09/15/2019   Vitamin D insufficiency 05/31/2019   Past Surgical History:  Procedure Laterality Date   CHOLECYSTECTOMY  2001    Family History  Problem Relation Age of Onset   Heart attack Mother    Leukemia Father    Social History   Socioeconomic History   Marital status: Widowed    Spouse name: Not on file   Number of children: 2   Years of education: Not on file   Highest education level: Not on file  Occupational History   Occupation: Retired  Tobacco Use   Smoking status: Never   Smokeless tobacco: Never  Vaping Use   Vaping status: Never Used  Substance and Sexual Activity   Alcohol use: Not Currently   Drug use: Never   Sexual activity: Not Currently  Other Topics Concern   Not on file  Social History  Narrative   Daughter lives next door and packs medications for her   Social Determinants of Health   Financial Resource Strain: Low Risk  (02/20/2023)   Overall Financial Resource Strain (CARDIA)    Difficulty of Paying Living Expenses: Not hard at all  Food Insecurity: No Food Insecurity (02/20/2023)   Hunger Vital Sign    Worried About Running Out of Food in the Last Year: Never true    Ran Out of Food in the Last Year: Never true  Transportation Needs: No Transportation Needs (02/20/2023)   PRAPARE - Administrator, Civil Service (Medical): No    Lack of Transportation (Non-Medical): No  Physical Activity: Insufficiently Active (02/20/2023)   Exercise Vital Sign    Days of Exercise per Week: 4 days    Minutes of Exercise per Session: 20 min  Stress: No Stress Concern Present (02/20/2023)   [employer] of Occupational Health - Occupational Stress Questionnaire    Feeling of Stress : Only a little  Social Connections: Socially Isolated (02/20/2023)   Social Connection and Isolation Panel [NHANES]    Frequency of Communication with Friends and Family: Three times a week    Frequency of Social Gatherings with Friends and Family: Three times a week    Attends Religious Services: Never    Active Member of Clubs or Organizations: No    Attends Banker Meetings: Never    Marital Status: Widowed    Objective:  LMP  (LMP Unknown)      02/20/2023    1:59 PM 11/15/2022    9:27 AM 07/29/2022    8:53 AM  BP/Weight  Systolic BP 122 130 136  Diastolic BP 58 62 72  Wt. (Lbs) 123 121 127  BMI 20.47 kg/m2 20.14 kg/m2 21.13 kg/m2    Physical Exam  Diabetic Foot Exam - Simple   No data filed      Lab Results  Component Value Date   WBC 7.5 11/15/2022   HGB 11.1 11/15/2022   HCT 35.0 11/15/2022   PLT 393 11/15/2022   GLUCOSE 78 11/15/2022   CHOL 167 11/15/2022   TRIG 132 11/15/2022   HDL 66 11/15/2022   LDLCALC 78 11/15/2022   ALT 12  11/15/2022   AST 26 11/15/2022   NA 140 11/15/2022   K 5.5 (H) 11/15/2022   CL 105 11/15/2022   CREATININE 1.05 (H) 11/15/2022  BUN 19 11/15/2022   CO2 19 (L) 11/15/2022   TSH 1.870 07/29/2022      Assessment & Plan:    Mixed hyperlipidemia Assessment & Plan: Well controlled.  No changes to medicines. Continue simvastatin 40 mg daily.  Continue to work on eating a healthy diet and exercise.  Labs drawn today.     Major depressive disorder, recurrent episode, mild with melancholic features (HCC) Assessment & Plan: Continue on zoloft 50 mg daily.    Hypertensive renal disease, benign, stage 1-4 or unspecified chronic kidney disease Assessment & Plan: The current medical regimen is effective;  continue present plan and medications. Continue  metoprolol 25 mg twice daily and losartan 25 mg daily. Check labs.    Other specified hypothyroidism Assessment & Plan: Previously well controlled Continue Synthroid at current dose         No orders of the defined types were placed in this encounter.   No orders of the defined types were placed in this encounter.    Follow-up: No follow-ups on file.   I,Marla I Leal-Borjas,acting as a scribe for Blane Ohara, MD.,have documented all relevant documentation on the behalf of Blane Ohara, MD,as directed by  Blane Ohara, MD while in the presence of Blane Ohara, MD.   An After Visit Summary was printed and given to the patient.  Blane Ohara, MD [facility] [phone]

## 2023-02-23 NOTE — Assessment & Plan Note (Signed)
The current medical regimen is effective;  continue present plan and medications. Continue  metoprolol 25 mg twice daily. Recommend stop lisinopril and start losartan 25 mg daily to avoid hyperkalemia.  Check labs.

## 2023-02-23 NOTE — Assessment & Plan Note (Signed)
Continue on zoloft 50 mg daily.

## 2023-02-24 ENCOUNTER — Ambulatory Visit (INDEPENDENT_AMBULATORY_CARE_PROVIDER_SITE_OTHER): Payer: Medicare Other | Admitting: Family Medicine

## 2023-02-24 ENCOUNTER — Encounter: Payer: Self-pay | Admitting: Family Medicine

## 2023-02-24 VITALS — BP 126/58 | HR 78 | Temp 98.0°F | Ht 65.0 in | Wt 122.0 lb

## 2023-02-24 DIAGNOSIS — F330 Major depressive disorder, recurrent, mild: Secondary | ICD-10-CM | POA: Diagnosis not present

## 2023-02-24 DIAGNOSIS — R413 Other amnesia: Secondary | ICD-10-CM | POA: Diagnosis not present

## 2023-02-24 DIAGNOSIS — N1831 Chronic kidney disease, stage 3a: Secondary | ICD-10-CM

## 2023-02-24 DIAGNOSIS — I129 Hypertensive chronic kidney disease with stage 1 through stage 4 chronic kidney disease, or unspecified chronic kidney disease: Secondary | ICD-10-CM | POA: Diagnosis not present

## 2023-02-24 DIAGNOSIS — E782 Mixed hyperlipidemia: Secondary | ICD-10-CM

## 2023-02-24 DIAGNOSIS — E038 Other specified hypothyroidism: Secondary | ICD-10-CM | POA: Diagnosis not present

## 2023-02-24 LAB — COMPREHENSIVE METABOLIC PANEL
ALT: 16 [IU]/L (ref 0–32)
AST: 29 [IU]/L (ref 0–40)
Albumin: 4.6 g/dL (ref 3.7–4.7)
Alkaline Phosphatase: 55 [IU]/L (ref 44–121)
BUN/Creatinine Ratio: 19 (ref 12–28)
BUN: 24 mg/dL (ref 8–27)
Bilirubin Total: 0.3 mg/dL (ref 0.0–1.2)
CO2: 20 mmol/L (ref 20–29)
Calcium: 9.9 mg/dL (ref 8.7–10.3)
Chloride: 99 mmol/L (ref 96–106)
Creatinine, Ser: 1.27 mg/dL — ABNORMAL HIGH (ref 0.57–1.00)
Globulin, Total: 2.8 g/dL (ref 1.5–4.5)
Glucose: 87 mg/dL (ref 70–99)
Potassium: 5.2 mmol/L (ref 3.5–5.2)
Sodium: 136 mmol/L (ref 134–144)
Total Protein: 7.4 g/dL (ref 6.0–8.5)
eGFR: 41 mL/min/{1.73_m2} — ABNORMAL LOW (ref >59)

## 2023-02-24 NOTE — Patient Instructions (Addendum)
Consider donepezil for memory loss and then consider adding memantine. These are used to treat Alzheimer's disease.   Recommend mri of brain If labs good. Please let me know if you would like to get the mri or proceed with one of medications.   Start losartan 25 mg daily. Replaces lisinopril.

## 2023-02-24 NOTE — Assessment & Plan Note (Signed)
Check cmp 

## 2023-02-24 NOTE — Assessment & Plan Note (Addendum)
Check b12, folate.  Consider mri of brain.  Consider empiric treatment with donepezil and then namenda.

## 2023-02-25 LAB — B12 AND FOLATE PANEL
Folate: >20 ng/mL (ref >3.0)
Vitamin B-12: 560 pg/mL (ref 232–1245)

## 2023-02-26 ENCOUNTER — Other Ambulatory Visit: Payer: Self-pay

## 2023-02-26 DIAGNOSIS — N289 Disorder of kidney and ureter, unspecified: Secondary | ICD-10-CM

## 2023-02-27 ENCOUNTER — Ambulatory Visit: Payer: Medicare Other | Attending: Cardiology | Admitting: Cardiology

## 2023-02-27 ENCOUNTER — Encounter: Payer: Self-pay | Admitting: Cardiology

## 2023-02-27 VITALS — BP 170/75 | HR 65 | Ht 65.5 in | Wt 125.8 lb

## 2023-02-27 DIAGNOSIS — E782 Mixed hyperlipidemia: Secondary | ICD-10-CM | POA: Insufficient documentation

## 2023-02-27 DIAGNOSIS — I10 Essential (primary) hypertension: Secondary | ICD-10-CM | POA: Diagnosis not present

## 2023-02-27 DIAGNOSIS — N1831 Chronic kidney disease, stage 3a: Secondary | ICD-10-CM | POA: Insufficient documentation

## 2023-02-27 DIAGNOSIS — K2100 Gastro-esophageal reflux disease with esophagitis, without bleeding: Secondary | ICD-10-CM | POA: Insufficient documentation

## 2023-02-27 NOTE — Patient Instructions (Signed)

## 2023-02-27 NOTE — Progress Notes (Signed)
Cardiology Office Note:    Date:  02/27/2023   ID:  Karen Briggs, DOB Jan 21, 1935, MRN [id number]  PCP:  Blane Ohara, MD  Cardiologist:  Gypsy Balsam, MD    Referring MD: Blane Ohara, MD   Chief Complaint  Patient presents with   Medication Management    Bp MEDS    History of Present Illness:    Karen Briggs is a 88 y.o. female with past medical history significant for palpitations, successfully suppressed with beta-blocker, essential hypertension, chronic kidney failure, dyslipidemia.  Comes today to months for follow-up overall doing well.  She is 88 years old she looks pretty good she is pedaling around does things at home with no difficulties.  Denies have any chest pain tightness squeezing pressure burning chest  Past Medical History:  Diagnosis Date   BMI 26.0-26.9,adult 09/16/2019   Chronic kidney disease, stage 3a (HCC) 05/31/2019   Essential hypertension 10/17/2017   Iron deficiency anemia 05/31/2019   Kidney disease    Stage 3   Mixed hyperlipidemia 10/17/2017   Osteoporosis 12/07/2019   Other specified hypothyroidism 10/17/2017   Palpitations 10/17/2017   Plantar wart 09/15/2019   Vitamin D insufficiency 05/31/2019    Past Surgical History:  Procedure Laterality Date   CHOLECYSTECTOMY  2001    Current Medications: Current Meds  Medication Sig   acetaminophen (TYLENOL) 500 MG tablet Take 500 mg by mouth as needed for moderate pain.   aspirin EC 81 MG tablet Take 81 mg by mouth daily.   denosumab (PROLIA) 60 MG/ML SOSY injection Inject 60 mg into the skin every 6 (six) months.   levothyroxine (SYNTHROID) 75 MCG tablet TAKE 1 TABLET EVERY DAY BEFORE BREAKFAST (Patient taking differently: Take 75 mcg by mouth daily before breakfast.)   losartan (COZAAR) 25 MG tablet Take 1 tablet (25 mg total) by mouth daily.   metoprolol tartrate (LOPRESSOR) 25 MG tablet TAKE 1 TABLET TWICE DAILY (Patient taking differently: Take 25 mg by mouth 2 (two) times daily.)   Multiple  Vitamin (MULTIVITAMIN) capsule Take 1 capsule by mouth daily.   omeprazole (PRILOSEC) 20 MG capsule TAKE 1 CAPSULE EVERY DAY (Patient taking differently: Take 20 mg by mouth daily.)   sertraline (ZOLOFT) 50 MG tablet TAKE 2 TABLETS (100 MG) BY MOUTH DAILY (Patient taking differently: Take 50 mg by mouth daily.)   simvastatin (ZOCOR) 40 MG tablet TAKE 1 TABLET EVERY DAY (Patient taking differently: Take 40 mg by mouth daily at 6 PM.)   SODIUM FLUORIDE 5000 PPM 1.1 % GEL dental gel Place 1 Application onto teeth at bedtime.     Allergies:   Levofloxacin, Alendronate, and Sulfa antibiotics   Social History   Socioeconomic History   Marital status: Widowed    Spouse name: Not on file   Number of children: 2   Years of education: Not on file   Highest education level: Not on file  Occupational History   Occupation: Retired  Tobacco Use   Smoking status: Never   Smokeless tobacco: Never  Vaping Use   Vaping status: Never Used  Substance and Sexual Activity   Alcohol use: Not Currently   Drug use: Never   Sexual activity: Not Currently  Other Topics Concern   Not on file  Social History Narrative   Daughter lives next door and packs medications for her   Social Determinants of Health   Financial Resource Strain: Low Risk  (02/20/2023)   Overall Financial Resource Strain (CARDIA)    Difficulty of Paying  Living Expenses: Not hard at all  Food Insecurity: No Food Insecurity (02/20/2023)   Hunger Vital Sign    Worried About Running Out of Food in the Last Year: Never true    Ran Out of Food in the Last Year: Never true  Transportation Needs: No Transportation Needs (02/20/2023)   PRAPARE - Administrator, Civil Service (Medical): No    Lack of Transportation (Non-Medical): No  Physical Activity: Insufficiently Active (02/20/2023)   Exercise Vital Sign    Days of Exercise per Week: 4 days    Minutes of Exercise per Session: 20 min  Stress: No Stress Concern Present  (02/20/2023)   [employer] of Occupational Health - Occupational Stress Questionnaire    Feeling of Stress : Only a little  Social Connections: Socially Isolated (02/20/2023)   Social Connection and Isolation Panel [NHANES]    Frequency of Communication with Friends and Family: Three times a week    Frequency of Social Gatherings with Friends and Family: Three times a week    Attends Religious Services: Never    Active Member of Clubs or Organizations: No    Attends Banker Meetings: Never    Marital Status: Widowed     Family History: The patient's family history includes Heart attack in her mother; Leukemia in her father. ROS:   Please see the history of present illness.    All 14 point review of systems negative except as described per history of present illness  EKGs/Labs/Other Studies Reviewed:    EKG Interpretation Date/Time:  Thursday February 27 2023 15:04:17 EST Ventricular Rate:  64 PR Interval:  190 QRS Duration:  68 QT Interval:  418 QTC Calculation: 431 R Axis:   71  Text Interpretation: Normal sinus rhythm Low voltage QRS Borderline ECG No previous ECGs available Confirmed by Gypsy Balsam [phone]) on 02/27/2023 3:20:46 PM    Recent Labs: 07/29/2022: TSH 1.870 11/15/2022: Hemoglobin 11.1; Platelets 393 02/24/2023: ALT 16; BUN 24; Creatinine, Ser 1.27; Potassium 5.2; Sodium 136  Recent Lipid Panel    Component Value Date/Time   CHOL 167 11/15/2022 1014   TRIG 132 11/15/2022 1014   HDL 66 11/15/2022 1014   CHOLHDL 2.5 11/15/2022 1014   LDLCALC 78 11/15/2022 1014    Physical Exam:    VS:  BP (!) 170/75 (BP Location: Left Arm, Patient Position: Sitting)   Pulse 65   Ht 5' 5.5" (1.664 m)   Wt 125 lb 12.8 oz (57.1 kg)   LMP  (LMP Unknown)   SpO2 97%   BMI 20.62 kg/m     Wt Readings from Last 3 Encounters:  02/27/23 125 lb 12.8 oz (57.1 kg)  02/24/23 122 lb (55.3 kg)  02/20/23 123 lb (55.8 kg)     GEN:  Well nourished, well  developed in no acute distress HEENT: Normal NECK: No JVD; No carotid bruits LYMPHATICS: No lymphadenopathy CARDIAC: RRR, no murmurs, no rubs, no gallops RESPIRATORY:  Clear to auscultation without rales, wheezing or rhonchi  ABDOMEN: Soft, non-tender, non-distended MUSCULOSKELETAL:  No edema; No deformity  SKIN: Warm and dry LOWER EXTREMITIES: no swelling NEUROLOGIC:  Alert and oriented x 3 PSYCHIATRIC:  Normal affect   ASSESSMENT:    1. Essential hypertension   2. Mixed hyperlipidemia   3. Gastroesophageal reflux disease with esophagitis without hemorrhage   4. Chronic kidney disease, stage 3a (HCC)    PLAN:    In order of problems listed above:  Essential hypertension: Blood pressure is always  elevated in the office however the check blood pressure at home usually less than 120 systolic, therefore, I will not change any of her medications. Dyslipidemia I did review K PN which show LDL 78 HDL 66 this from summer of this year continue present management which include Zocor 40. Gastroesophageal reflux disease stable. Chronic kidney disease last creatinine 1.27 followed by primary care physician.   Medication Adjustments/Labs and Tests Ordered: Current medicines are reviewed at length with the patient today.  Concerns regarding medicines are outlined above.  Orders Placed This Encounter  Procedures   EKG 12-Lead   Medication changes: No orders of the defined types were placed in this encounter.   Signed, Georgeanna Lea, MD, [facility] 02/27/2023 3:28 PM    [facility]

## 2023-03-02 ENCOUNTER — Emergency Department (HOSPITAL_COMMUNITY): Payer: Medicare Other

## 2023-03-02 ENCOUNTER — Other Ambulatory Visit: Payer: Self-pay

## 2023-03-02 ENCOUNTER — Emergency Department (HOSPITAL_COMMUNITY)
Admission: EM | Admit: 2023-03-02 | Discharge: 2023-03-02 | Disposition: A | Discharge status: Home / Self Care | Payer: Medicare Other | Attending: Emergency Medicine | Admitting: Emergency Medicine

## 2023-03-02 ENCOUNTER — Encounter (HOSPITAL_COMMUNITY): Payer: Self-pay

## 2023-03-02 DIAGNOSIS — I6782 Cerebral ischemia: Secondary | ICD-10-CM | POA: Diagnosis not present

## 2023-03-02 DIAGNOSIS — W108XXA Fall (on) (from) other stairs and steps, initial encounter: Secondary | ICD-10-CM | POA: Diagnosis not present

## 2023-03-02 DIAGNOSIS — R220 Localized swelling, mass and lump, head: Secondary | ICD-10-CM | POA: Diagnosis not present

## 2023-03-02 DIAGNOSIS — S0012XA Contusion of left eyelid and periocular area, initial encounter: Secondary | ICD-10-CM | POA: Diagnosis not present

## 2023-03-02 DIAGNOSIS — S00511A Abrasion of lip, initial encounter: Secondary | ICD-10-CM | POA: Insufficient documentation

## 2023-03-02 DIAGNOSIS — S0993XA Unspecified injury of face, initial encounter: Secondary | ICD-10-CM | POA: Diagnosis present

## 2023-03-02 DIAGNOSIS — S199XXA Unspecified injury of neck, initial encounter: Secondary | ICD-10-CM | POA: Diagnosis not present

## 2023-03-02 DIAGNOSIS — S0990XA Unspecified injury of head, initial encounter: Secondary | ICD-10-CM | POA: Diagnosis not present

## 2023-03-02 DIAGNOSIS — D72829 Elevated white blood cell count, unspecified: Secondary | ICD-10-CM | POA: Diagnosis not present

## 2023-03-02 DIAGNOSIS — M47812 Spondylosis without myelopathy or radiculopathy, cervical region: Secondary | ICD-10-CM | POA: Diagnosis not present

## 2023-03-02 DIAGNOSIS — Z7982 Long term (current) use of aspirin: Secondary | ICD-10-CM | POA: Insufficient documentation

## 2023-03-02 DIAGNOSIS — I10 Essential (primary) hypertension: Secondary | ICD-10-CM | POA: Diagnosis not present

## 2023-03-02 DIAGNOSIS — W19XXXA Unspecified fall, initial encounter: Secondary | ICD-10-CM

## 2023-03-02 DIAGNOSIS — S0512XA Contusion of eyeball and orbital tissues, left eye, initial encounter: Secondary | ICD-10-CM | POA: Diagnosis not present

## 2023-03-02 DIAGNOSIS — G319 Degenerative disease of nervous system, unspecified: Secondary | ICD-10-CM | POA: Diagnosis not present

## 2023-03-02 LAB — BASIC METABOLIC PANEL
Anion gap: 7 (ref 5–15)
BUN: 17 mg/dL (ref 8–23)
CO2: 24 mmol/L (ref 22–32)
Calcium: 9 mg/dL (ref 8.9–10.3)
Chloride: 100 mmol/L (ref 98–111)
Creatinine, Ser: 1.19 mg/dL — ABNORMAL HIGH (ref 0.44–1.00)
GFR, Estimated: 45 mL/min — ABNORMAL LOW (ref >60)
Glucose, Bld: 139 mg/dL — ABNORMAL HIGH (ref 70–99)
Potassium: 4.3 mmol/L (ref 3.5–5.1)
Sodium: 131 mmol/L — ABNORMAL LOW (ref 135–145)

## 2023-03-02 LAB — CBC
HCT: 30.6 % — ABNORMAL LOW (ref 36.0–46.0)
Hemoglobin: 9.8 g/dL — ABNORMAL LOW (ref 12.0–15.0)
MCH: 30.5 pg (ref 26.0–34.0)
MCHC: 32 g/dL (ref 30.0–36.0)
MCV: 95.3 fL (ref 80.0–100.0)
Platelets: 334 10*3/uL (ref 150–400)
RBC: 3.21 MIL/uL — ABNORMAL LOW (ref 3.87–5.11)
RDW: 13 % (ref 11.5–15.5)
WBC: 13.7 10*3/uL — ABNORMAL HIGH (ref 4.0–10.5)
nRBC: 0 % (ref 0.0–0.2)

## 2023-03-02 NOTE — ED Notes (Signed)
FELL APPROX. 2 HRS AGO DOWN STAIRS ON DECK. NO RAIL AND FELL OFF AND POSSIBLE HIT CORNER OF DECK. PT DOES NOT REMEMBER INCIDENT. UNWITNESSED FALL. FAMILY REPORTS SHE AMBULATED HERSELF BACK INSIDE AND CALLED FOR HELP. FAMILY REPORTS PT LIVES BY HERSELF AND FAMILY LIVES NEXT DOOR. NO REPORTED THINNERS, BUT DOES TAKE ASA 1X A DAY.

## 2023-03-02 NOTE — Discharge Instructions (Addendum)
The CT of your head did not show any signs of a brain bleed or skull fracture.  There were no injuries to your spine.  Labs today did show your hemoglobin which is one of your blood counts is slightly low at 9.8.  It appears her baseline is around 11.  Please follow this up with your PCP within the next week to ensure this level is not dropping further.  Your sodium was slightly low here. Please eat some salty snacks at home and keep well hydrated with water.  The bruising and cuts to your face will heal with time.  You may apply antibiotic ointment such as bacitracin or Neosporin over the abrasions to help with them heal.  You may take up to 1000mg  of tylenol every 6 hours as needed for pain. Do not take more then 4g per day.   Return to the ER for any severe headache, uncontrolled vomiting, any other new or concerning symptoms.

## 2023-03-02 NOTE — ED Provider Notes (Signed)
[place] EMERGENCY DEPARTMENT AT [facility] Provider Note   CSN: [id number] Arrival date & time: 03/02/23  1526     History  Chief Complaint  Patient presents with   Marletta Lor    Karen Briggs is a 88 y.o. female not on anticoagulation, presents with concern for fall earlier today.  States she was going down the stairs when she tripped and fell, hitting her head on the ground.  This was an unwitnessed fall.  She got back up and went next-door to her family's house to get help.  Currently denies any pain.  Denies any dizziness or chest pain before the fall or currently.   Fall Pertinent negatives include no chest pain.       Home Medications Prior to Admission medications   Medication Sig Start Date End Date Taking? Authorizing Provider  acetaminophen (TYLENOL) 500 MG tablet Take 500 mg by mouth as needed for moderate pain.    [provider]  aspirin EC 81 MG tablet Take 81 mg by mouth daily.    [provider]  denosumab (PROLIA) 60 MG/ML SOSY injection Inject 60 mg into the skin every 6 (six) months.    [provider]  levothyroxine (SYNTHROID) 75 MCG tablet TAKE 1 TABLET EVERY DAY BEFORE BREAKFAST Patient taking differently: Take 75 mcg by mouth daily before breakfast. 12/19/22   Cox, Kirsten, MD  losartan (COZAAR) 25 MG tablet Take 1 tablet (25 mg total) by mouth daily. 11/18/22   Cox, Fritzi Mandes, MD  metoprolol tartrate (LOPRESSOR) 25 MG tablet TAKE 1 TABLET TWICE DAILY Patient taking differently: Take 25 mg by mouth 2 (two) times daily. 12/19/22   CoxFritzi Mandes, MD  Multiple Vitamin (MULTIVITAMIN) capsule Take 1 capsule by mouth daily.    [provider]  omeprazole (PRILOSEC) 20 MG capsule TAKE 1 CAPSULE EVERY DAY Patient taking differently: Take 20 mg by mouth daily. 12/19/22   Cox, Fritzi Mandes, MD  sertraline (ZOLOFT) 50 MG tablet TAKE 2 TABLETS (100 MG) BY MOUTH DAILY Patient taking differently: Take 50 mg by mouth daily. 01/27/23    Cox, Fritzi Mandes, MD  simvastatin (ZOCOR) 40 MG tablet TAKE 1 TABLET EVERY DAY Patient taking differently: Take 40 mg by mouth daily at 6 PM. 12/19/22   Cox, Kirsten, MD  SODIUM FLUORIDE 5000 PPM 1.1 % GEL dental gel Place 1 Application onto teeth at bedtime. 08/01/21   [provider]      Allergies    Levofloxacin, Alendronate, and Sulfa antibiotics    Review of Systems   Review of Systems  Cardiovascular:  Negative for chest pain.  Neurological:  Negative for dizziness.    Physical Exam Updated Vital Signs BP (!) 134/58   Pulse 66   Temp 98 F (36.7 C) (Oral)   Resp 20   Ht 5\' 5"  (1.651 m)   Wt 56.7 kg   LMP  (LMP Unknown)   SpO2 99%   BMI 20.80 kg/m  Physical Exam Vitals and nursing note reviewed.  Constitutional:      General: She is not in acute distress.    Appearance: She is well-developed.     Comments: Well-appearing, answering questions appropriately  HENT:     Head: Normocephalic and atraumatic.  Eyes:     Conjunctiva/sclera: Conjunctivae normal.  Cardiovascular:     Rate and Rhythm: Normal rate and regular rhythm.     Heart sounds: No murmur heard. Pulmonary:     Effort: Pulmonary effort is normal. No respiratory distress.  Breath sounds: Normal breath sounds.  Abdominal:     Palpations: Abdomen is soft.     Tenderness: There is no abdominal tenderness.  Musculoskeletal:        General: No swelling.     Cervical back: Normal range of motion and neck supple.     Comments: No tenderness diffusely of the spinous processes, chest wall, upper extremities bilaterally, lower extremities bilaterally  Able to move upper and lower extremities without difficulty  Able to ambulate without assistance and without difficulty  Skin:    General: Skin is warm and dry.     Capillary Refill: Capillary refill takes less than 2 seconds.     Comments: Edema and abrasion to the upper lip  Superficial linear abrasion approximately 3 cm across left eyebrow, mild  ecchymoses surrounding the left eye  Neurological:     Mental Status: She is alert.  Psychiatric:        Mood and Affect: Mood normal.     ED Results / Procedures / Treatments   Labs (all labs ordered are listed, but only abnormal results are displayed) Labs Reviewed  BASIC METABOLIC PANEL - Abnormal; Notable for the following components:      Result Value   Sodium 131 (*)    Glucose, Bld 139 (*)    Creatinine, Ser 1.19 (*)    GFR, Estimated 45 (*)    All other components within normal limits  CBC - Abnormal; Notable for the following components:   WBC 13.7 (*)    RBC 3.21 (*)    Hemoglobin 9.8 (*)    HCT 30.6 (*)    All other components within normal limits    EKG EKG Interpretation Date/Time:  Sunday March 02 2023 15:42:30 EST Ventricular Rate:  69 PR Interval:  174 QRS Duration:  86 QT Interval:  396 QTC Calculation: 424 R Axis:   59  Text Interpretation: Normal sinus rhythm Low voltage QRS Borderline ECG When compared with ECG of 27-Feb-2023 15:04, PREVIOUS ECG IS PRESENT Confirmed by Anders Simmonds [phone]) on 03/02/2023 6:07:32 PM  Radiology CT HEAD WO CONTRAST  Result Date: 03/02/2023 CLINICAL DATA:  Head trauma EXAM: CT HEAD WITHOUT CONTRAST CT MAXILLOFACIAL WITHOUT CONTRAST CT CERVICAL SPINE WITHOUT CONTRAST TECHNIQUE: Multidetector CT imaging of the head, cervical spine, and maxillofacial structures were performed using the standard protocol without intravenous contrast. Multiplanar CT image reconstructions of the cervical spine and maxillofacial structures were also generated. RADIATION DOSE REDUCTION: This exam was performed according to the departmental dose-optimization program which includes automated exposure control, adjustment of the mA and/or kV according to patient size and/or use of iterative reconstruction technique. COMPARISON:  None Available. FINDINGS: CT HEAD FINDINGS Brain: No evidence of acute infarction, hemorrhage, hydrocephalus, extra-axial  collection or mass lesion/mass effect. There is mild diffuse atrophy and mild periventricular white matter hypodensity, likely chronic small vessel ischemic change. Vascular: Atherosclerotic calcifications are present within the cavernous internal carotid arteries. Skull: Normal. Negative for fracture or focal lesion. Other: None. CT MAXILLOFACIAL FINDINGS Osseous: No fracture or mandibular dislocation. No destructive process. Orbits: Negative. No traumatic or inflammatory finding. Sinuses: There are small air-fluid levels in the bilateral sphenoid sinuses with mucosal thickening. Soft tissues: There is mild left periorbital soft tissue swelling. CT CERVICAL SPINE FINDINGS Alignment: Normal. Skull base and vertebrae: No acute fracture. No primary bone lesion or focal pathologic process. Soft tissues and spinal canal: No prevertebral fluid or swelling. No visible canal hematoma. Disc levels: There is mild disc space  narrowing and endplate osteophyte formation throughout the cervical spine with some uncovertebral spurring compatible with degenerative change. There is mild neural foraminal stenosis bilaterally at C4-C5 and C5-C6. There is no significant central canal stenosis at any level. Upper chest: Negative. Other: None. IMPRESSION: 1. No acute intracranial process. 2. No acute facial bone fracture. 3. Mild left periorbital soft tissue swelling. 4. No acute fracture or traumatic subluxation of the cervical spine. 5. Mild degenerative changes of the cervical spine. Electronically Signed   By: Darliss Cheney M.D.   On: 03/02/2023 17:50   CT Cervical Spine Wo Contrast  Result Date: 03/02/2023 CLINICAL DATA:  Head trauma EXAM: CT HEAD WITHOUT CONTRAST CT MAXILLOFACIAL WITHOUT CONTRAST CT CERVICAL SPINE WITHOUT CONTRAST TECHNIQUE: Multidetector CT imaging of the head, cervical spine, and maxillofacial structures were performed using the standard protocol without intravenous contrast. Multiplanar CT image  reconstructions of the cervical spine and maxillofacial structures were also generated. RADIATION DOSE REDUCTION: This exam was performed according to the departmental dose-optimization program which includes automated exposure control, adjustment of the mA and/or kV according to patient size and/or use of iterative reconstruction technique. COMPARISON:  None Available. FINDINGS: CT HEAD FINDINGS Brain: No evidence of acute infarction, hemorrhage, hydrocephalus, extra-axial collection or mass lesion/mass effect. There is mild diffuse atrophy and mild periventricular white matter hypodensity, likely chronic small vessel ischemic change. Vascular: Atherosclerotic calcifications are present within the cavernous internal carotid arteries. Skull: Normal. Negative for fracture or focal lesion. Other: None. CT MAXILLOFACIAL FINDINGS Osseous: No fracture or mandibular dislocation. No destructive process. Orbits: Negative. No traumatic or inflammatory finding. Sinuses: There are small air-fluid levels in the bilateral sphenoid sinuses with mucosal thickening. Soft tissues: There is mild left periorbital soft tissue swelling. CT CERVICAL SPINE FINDINGS Alignment: Normal. Skull base and vertebrae: No acute fracture. No primary bone lesion or focal pathologic process. Soft tissues and spinal canal: No prevertebral fluid or swelling. No visible canal hematoma. Disc levels: There is mild disc space narrowing and endplate osteophyte formation throughout the cervical spine with some uncovertebral spurring compatible with degenerative change. There is mild neural foraminal stenosis bilaterally at C4-C5 and C5-C6. There is no significant central canal stenosis at any level. Upper chest: Negative. Other: None. IMPRESSION: 1. No acute intracranial process. 2. No acute facial bone fracture. 3. Mild left periorbital soft tissue swelling. 4. No acute fracture or traumatic subluxation of the cervical spine. 5. Mild degenerative changes of  the cervical spine. Electronically Signed   By: Darliss Cheney M.D.   On: 03/02/2023 17:50   CT Maxillofacial Wo Contrast  Result Date: 03/02/2023 CLINICAL DATA:  Head trauma EXAM: CT HEAD WITHOUT CONTRAST CT MAXILLOFACIAL WITHOUT CONTRAST CT CERVICAL SPINE WITHOUT CONTRAST TECHNIQUE: Multidetector CT imaging of the head, cervical spine, and maxillofacial structures were performed using the standard protocol without intravenous contrast. Multiplanar CT image reconstructions of the cervical spine and maxillofacial structures were also generated. RADIATION DOSE REDUCTION: This exam was performed according to the departmental dose-optimization program which includes automated exposure control, adjustment of the mA and/or kV according to patient size and/or use of iterative reconstruction technique. COMPARISON:  None Available. FINDINGS: CT HEAD FINDINGS Brain: No evidence of acute infarction, hemorrhage, hydrocephalus, extra-axial collection or mass lesion/mass effect. There is mild diffuse atrophy and mild periventricular white matter hypodensity, likely chronic small vessel ischemic change. Vascular: Atherosclerotic calcifications are present within the cavernous internal carotid arteries. Skull: Normal. Negative for fracture or focal lesion. Other: None. CT MAXILLOFACIAL FINDINGS Osseous: No fracture  or mandibular dislocation. No destructive process. Orbits: Negative. No traumatic or inflammatory finding. Sinuses: There are small air-fluid levels in the bilateral sphenoid sinuses with mucosal thickening. Soft tissues: There is mild left periorbital soft tissue swelling. CT CERVICAL SPINE FINDINGS Alignment: Normal. Skull base and vertebrae: No acute fracture. No primary bone lesion or focal pathologic process. Soft tissues and spinal canal: No prevertebral fluid or swelling. No visible canal hematoma. Disc levels: There is mild disc space narrowing and endplate osteophyte formation throughout the cervical spine  with some uncovertebral spurring compatible with degenerative change. There is mild neural foraminal stenosis bilaterally at C4-C5 and C5-C6. There is no significant central canal stenosis at any level. Upper chest: Negative. Other: None. IMPRESSION: 1. No acute intracranial process. 2. No acute facial bone fracture. 3. Mild left periorbital soft tissue swelling. 4. No acute fracture or traumatic subluxation of the cervical spine. 5. Mild degenerative changes of the cervical spine. Electronically Signed   By: Darliss Cheney M.D.   On: 03/02/2023 17:50    Procedures Procedures    Medications Ordered in ED Medications - No data to display  ED Course/ Medical Decision Making/ A&P                                 Medical Decision Making Amount and/or Complexity of Data Reviewed Labs: ordered. Radiology: ordered.     Differential diagnosis includes but is not limited to mechanical fall, arrhythmia, electrolyte abnormality, cranial hemorrhage, skull fracture, contusion  ED Course:  Patient overall well-appearing.  She denies any pain currently.  Her story is consistent with mechanical fall. No tenderness to palpation diffusely of the upper and lower extremities, no abdominal tenderness to palpation.  She does have slight abrasion above the left eyebrow and above left lip, but no laceration needing repair.  Given mechanism of injury, CT head, CT cervical spine, and CT maxillofacial was obtained that showed no acute abnormalities.  BMP with slight hypokalemia at 131.  She does have elevated creatinine, but is consistent with baseline.  CBC with hemoglobin at 9.8.  This is down from 11.1 taken 3 months ago.  Not a significant drop, will have her follow this up with PCP. Unlikely this was the cause of her fall.  Patient remained stable and is able to ambulate without difficulty.  Appropriate for discharge home at this time.  Impression: Mechanical fall  Disposition:  The patient was discharged  home with instructions to follow-up with PCP regarding hemoglobin value within the next week.  Increase salt intake slightly at home to help with hyponatremia.  May apply antibiotic ointment over the abrasions to help with healing. Return precautions given.  Lab Tests: I Ordered, and personally interpreted labs.  The pertinent results include:   CBC with hemoglobin 9.8, leukocytosis of 13.7 BMP with hyponatremia 131, creatinine 1.19 which is at patient baseline  Imaging Studies ordered: I ordered imaging studies including CT head, CT cervical spine, CT maxillofacial I independently visualized the imaging with scope of interpretation limited to determining acute life threatening conditions related to emergency care. Imaging showed no acute abnormalities I agree with the radiologist interpretation   Cardiac Monitoring: / EKG: The patient was maintained on a cardiac monitor.  I personally viewed and interpreted the cardiac monitored which showed an underlying rhythm of: Normal sinus rhythm               Final Clinical Impression(s) /  ED Diagnoses Final diagnoses:  Fall, initial encounter  Contusion of left eyelid, initial encounter    Rx / DC Orders ED Discharge Orders     None         Arabella Merles, PA-C 03/02/23 1811    Anders Simmonds T, DO 03/02/23 2014

## 2023-03-02 NOTE — ED Triage Notes (Addendum)
Pt arrives with c/o head and face injury after falling this afternoon. Per pt, she was outside and tripped and fell hitting her head and face on the ground. Pt has bruising and swelling to her left eye and upper lip. Pt has abrasion around left eye and small cut on the inside of her lip. Pt denies neck or back pain. Pt denies blood thinners, but does take aspirin. Pt unsure if she LOC before falling. Pt endorsed 1 episode of vomiting after fall. Pt a&ox4. Pt does hx of dementia. Pt ambulatory.

## 2023-03-12 ENCOUNTER — Ambulatory Visit (INDEPENDENT_AMBULATORY_CARE_PROVIDER_SITE_OTHER): Payer: Medicare Other | Admitting: Podiatry

## 2023-03-12 ENCOUNTER — Encounter: Payer: Self-pay | Admitting: Podiatry

## 2023-03-12 DIAGNOSIS — M79674 Pain in right toe(s): Secondary | ICD-10-CM

## 2023-03-12 DIAGNOSIS — I739 Peripheral vascular disease, unspecified: Secondary | ICD-10-CM

## 2023-03-12 DIAGNOSIS — B351 Tinea unguium: Secondary | ICD-10-CM | POA: Diagnosis not present

## 2023-03-12 DIAGNOSIS — M79675 Pain in left toe(s): Secondary | ICD-10-CM

## 2023-03-12 NOTE — Progress Notes (Signed)
       Subjective:  Patient ID: Karen Briggs, female    DOB: 11/02/1934,  MRN: [id number]  Karen Briggs presents to clinic today for:  Chief Complaint  Patient presents with   RFC    Onycho. Possible ingrown left hallux nail. Irritated medial border and distal aspect   Patient notes nails are thick and elongated, causing pain in shoe gear when ambulating.  Patient unsure if she has an ingrown toenail to the left hallux medial nail border.  Family member is with her today.  PCP is Cox, Fritzi Mandes, MD. date last seen was 02/24/2023  Past Medical History:  Diagnosis Date   BMI 26.0-26.9,adult 09/16/2019   Chronic kidney disease, stage 3a (HCC) 05/31/2019   Essential hypertension 10/17/2017   Iron deficiency anemia 05/31/2019   Kidney disease    Stage 3   Mixed hyperlipidemia 10/17/2017   Osteoporosis 12/07/2019   Other specified hypothyroidism 10/17/2017   Palpitations 10/17/2017   Plantar wart 09/15/2019   Vitamin D insufficiency 05/31/2019    Allergies  Allergen Reactions   Levofloxacin Palpitations   Alendronate     diarrhea   Sulfa Antibiotics Nausea Only    Objective:  Karen Briggs is a pleasant 88 y.o. female in NAD. AAO x 3.  Vascular Examination: Patient has palpable DP pulse, absent PT pulse bilateral.  Delayed capillary refill bilateral toes.  Sparse digital hair bilateral.  Proximal to distal cooling WNL bilateral.    Dermatological Examination: Interspaces are clear with no open lesions noted bilateral.  Skin is shiny and atrophic bilateral.  Nails are 3-65mm thick, with yellowish/brown discoloration, subungual debris and distal onycholysis x10.  There is pain with compression of nails x10.  There are hyperkeratotic lesions noted along the distal medial edge of the left hallux nail corner/slight subungual in location.  The nail is not ingrown in this area  Patient qualifies for at-risk foot care because of PVD.  Assessment/Plan: 1. Pain due to onychomycosis of toenails of  both feet   2. PVD (peripheral vascular disease) (HCC)    Mycotic nails x10 were sharply debrided with sterile nail nippers and power debriding burr to decrease bulk and length.  The slight subungual corn on the left hallux distal medial nail corner was shaved with a sterile 313 blade.  Patient informed this could possibly recur.  Return in about 3 months (around 06/12/2023) for RFC.   Clerance Lav, DPM, FACFAS [facility]     [address] [place], [place] [address]                Office [phone]  Fax [phone]

## 2023-03-18 ENCOUNTER — Telehealth: Payer: Self-pay

## 2023-03-18 NOTE — Telephone Encounter (Signed)
Transition Care Management Unsuccessful Follow-up Telephone Call  Date of discharge and from where:  03/02/2023 [facility]  Attempts:  1st Attempt  Reason for unsuccessful TCM follow-up call:  Left voice message  Fabion Gatson [facility]  [facility], [facility] Resource Care Guide Direct Dial: [phone]  Website: [email or web address]

## 2023-03-19 ENCOUNTER — Telehealth: Payer: Self-pay

## 2023-03-19 NOTE — Telephone Encounter (Signed)
Transition Care Management Unsuccessful Follow-up Telephone Call  Date of discharge and from where:  03/02/2023 [facility]  Attempts:  2nd Attempt  Reason for unsuccessful TCM follow-up call:  Left voice message  [facility]  [facility], [facility] Resource Care Guide Direct Dial: [phone]  Website: [email or web address]

## 2023-04-01 ENCOUNTER — Telehealth: Payer: Self-pay

## 2023-04-01 NOTE — Telephone Encounter (Signed)
Copied from CRM [id number]. Topic: Clinical - Medical Advice >> Apr 01, 2023  3:07 PM Clayton Bibles wrote: Reason for CRM: Daughter, Carollee Herter, is calling because she think her mom has a UTI. She wanted to know if she could drop off a urine sample to have it tested. Please call [phone] Thanks

## 2023-04-01 NOTE — Telephone Encounter (Signed)
Spoke with patient daughter and appointment has been made for tomorrow morning.

## 2023-04-02 ENCOUNTER — Encounter: Payer: Self-pay | Admitting: Physician Assistant

## 2023-04-02 ENCOUNTER — Ambulatory Visit (INDEPENDENT_AMBULATORY_CARE_PROVIDER_SITE_OTHER): Payer: Medicare Other | Admitting: Physician Assistant

## 2023-04-02 VITALS — BP 132/68 | HR 72 | Temp 97.9°F | Ht 65.0 in | Wt 126.0 lb

## 2023-04-02 DIAGNOSIS — D508 Other iron deficiency anemias: Secondary | ICD-10-CM | POA: Diagnosis not present

## 2023-04-02 DIAGNOSIS — R899 Unspecified abnormal finding in specimens from other organs, systems and tissues: Secondary | ICD-10-CM | POA: Diagnosis not present

## 2023-04-02 DIAGNOSIS — N3001 Acute cystitis with hematuria: Secondary | ICD-10-CM

## 2023-04-02 DIAGNOSIS — E039 Hypothyroidism, unspecified: Secondary | ICD-10-CM

## 2023-04-02 LAB — POCT URINALYSIS DIP (CLINITEK)
Bilirubin, UA: NEGATIVE
Glucose, UA: NEGATIVE mg/dL
Ketones, POC UA: NEGATIVE mg/dL
Nitrite, UA: POSITIVE — AB
POC PROTEIN,UA: NEGATIVE
Spec Grav, UA: 1.015 (ref 1.010–1.025)
Urobilinogen, UA: NEGATIVE U/dL — AB
pH, UA: 6 (ref 5.0–8.0)

## 2023-04-02 MED ORDER — NITROFURANTOIN MONOHYD MACRO 100 MG PO CAPS: 100.0000 mg | ORAL_CAPSULE | Freq: Two times a day (BID) | ORAL | 0 refills | Status: DC

## 2023-04-02 NOTE — Progress Notes (Signed)
Acute Office Visit  Subjective:    Patient ID: Karen Briggs, female    DOB: 1935-02-19, 88 y.o.   MRN: [id number]  Chief Complaint  Patient presents with   UTI symptoms    HPI: Patient is in today with her daughter --- here to check urine for uti Daughter is concerned because over thanksgiving had noted pt getting up a few times to urinate when they were out of town.  Pt states it is usual for her to get up at least once nightly Pt denies dysuria, urgency or other frequency - no hematuria  Daughter also concerned about pt's labwork that was done at hospital on 11/10 after a fall ---- her wbc was slightly elevated and she thought possibly that could be caused by uti However upon reviewing labwork it is noted hgb down to 9.8 (was normal in July) - pt has history of iron def anemia but has not been on supplements in over a year - she denies fatigue, hematochezia or melena  Pt with hypothyroidism - currently on synthroid - last TSH in 4/24 normal - will recheck   Current Outpatient Medications:    acetaminophen (TYLENOL) 500 MG tablet, Take 500 mg by mouth as needed for moderate pain., Disp: , Rfl:    aspirin EC 81 MG tablet, Take 81 mg by mouth daily., Disp: , Rfl:    denosumab (PROLIA) 60 MG/ML SOSY injection, Inject 60 mg into the skin every 6 (six) months., Disp: , Rfl:    levothyroxine (SYNTHROID) 75 MCG tablet, TAKE 1 TABLET EVERY DAY BEFORE BREAKFAST (Patient taking differently: Take 75 mcg by mouth daily before breakfast.), Disp: 90 tablet, Rfl: 3   losartan (COZAAR) 25 MG tablet, Take 1 tablet (25 mg total) by mouth daily., Disp: 90 tablet, Rfl: 1   metoprolol tartrate (LOPRESSOR) 25 MG tablet, TAKE 1 TABLET TWICE DAILY (Patient taking differently: Take 25 mg by mouth 2 (two) times daily.), Disp: 180 tablet, Rfl: 3   Multiple Vitamin (MULTIVITAMIN) capsule, Take 1 capsule by mouth daily., Disp: , Rfl:    nitrofurantoin, macrocrystal-monohydrate, (MACROBID) 100 MG capsule,  Take 1 capsule (100 mg total) by mouth 2 (two) times daily., Disp: 20 capsule, Rfl: 0   omeprazole (PRILOSEC) 20 MG capsule, TAKE 1 CAPSULE EVERY DAY (Patient taking differently: Take 20 mg by mouth daily.), Disp: 90 capsule, Rfl: 3   sertraline (ZOLOFT) 25 MG tablet, Take 25 mg by mouth daily., Disp: , Rfl:    simvastatin (ZOCOR) 40 MG tablet, TAKE 1 TABLET EVERY DAY (Patient taking differently: Take 40 mg by mouth daily at 6 PM.), Disp: 90 tablet, Rfl: 3   SODIUM FLUORIDE 5000 PPM 1.1 % GEL dental gel, Place 1 Application onto teeth at bedtime., Disp: , Rfl:   Allergies  Allergen Reactions   Levofloxacin Palpitations   Alendronate     diarrhea   Sulfa Antibiotics Nausea Only    ROS CONSTITUTIONAL: Negative for chills, fatigue, fever, unintentional weight gain and unintentional weight loss.  E/N/T: Negative for ear pain, nasal congestion and sore throat.  CARDIOVASCULAR: Negative for chest pain, dizziness, palpitations and pedal edema.  RESPIRATORY: Negative for recent cough and dyspnea.  GASTROINTESTINAL: Negative for abdominal pain, acid reflux symptoms, constipation, diarrhea, nausea and vomiting. GU -- see HPI       Objective:    PHYSICAL EXAM:   BP 132/68 (BP Location: Left Arm, Patient Position: Sitting)   Pulse 72   Temp 97.9 F (36.6 C) (Temporal)  Ht 5\' 5"  (1.651 m)   Wt 126 lb (57.2 kg)   LMP  (LMP Unknown)   SpO2 98%   BMI 20.97 kg/m    GEN: Well nourished, well developed, in no acute distress   Cardiac: RRR; no murmurs, rubs, or gallops,no edema -  Respiratory:  normal respiratory rate and pattern with no distress - normal breath sounds with no rales, rhonchi, wheezes or rubs Skin: warm and dry, no rash  Psych: euthymic mood, appropriate affect and demeanor Office Visit on 04/02/2023  Component Date Value Ref Range Status   Glucose, UA 04/02/2023 negative  negative mg/dL Final   Bilirubin, UA 82/95/6213 negative  negative Final   Ketones, POC UA  04/02/2023 negative  negative mg/dL Final   Spec Grav, UA 08/65/7846 1.015  1.010 - 1.025 Final   Blood, UA 04/02/2023 trace-intact (A)  negative Final   pH, UA 04/02/2023 6.0  5.0 - 8.0 Final   POC PROTEIN,UA 04/02/2023 negative  negative, trace Final   Urobilinogen, UA 04/02/2023 negative (A)  0.2 or 1.0 E.U./dL Final   Nitrite, UA 96/29/5284 Positive (A)  Negative Final   Leukocytes, UA 04/02/2023 Large (3+) (A)  Negative Final       Assessment & Plan:    Abnormal laboratory test result -     CBC with Differential/Platelet -     Comprehensive metabolic panel -     TSH -     Iron, TIBC and Ferritin Panel  Other iron deficiency anemia -     CBC with Differential/Platelet -     Iron, TIBC and Ferritin Panel Hemoccult cards given Acquired hypothyroidism -     TSH Continue synthroid Acute cystitis with hematuria -     POCT URINALYSIS DIP (CLINITEK) Urine culture ordered Rx macrobid 100mg  bid     Follow-up: Return for in february as scheduled with Dr Sedalia Muta. And according to labs/urine culture  An After Visit Summary was printed and given to the patient.  Jettie Pagan [facility] [phone]

## 2023-04-03 LAB — CBC WITH DIFFERENTIAL/PLATELET
Basophils Absolute: 0.1 10*3/uL (ref 0.0–0.2)
Basos: 1 %
EOS (ABSOLUTE): 0.1 10*3/uL (ref 0.0–0.4)
Eos: 1 %
Hematocrit: 31.7 % — ABNORMAL LOW (ref 34.0–46.6)
Hemoglobin: 10.4 g/dL — ABNORMAL LOW (ref 11.1–15.9)
Immature Grans (Abs): 0 10*3/uL (ref 0.0–0.1)
Immature Granulocytes: 0 %
Lymphocytes Absolute: 1.8 10*3/uL (ref 0.7–3.1)
Lymphs: 21 %
MCH: 31.1 pg (ref 26.6–33.0)
MCHC: 32.8 g/dL (ref 31.5–35.7)
MCV: 95 fL (ref 79–97)
Monocytes Absolute: 0.6 10*3/uL (ref 0.1–0.9)
Monocytes: 8 %
Neutrophils Absolute: 5.7 10*3/uL (ref 1.4–7.0)
Neutrophils: 69 %
Platelets: 311 10*3/uL (ref 150–450)
RBC: 3.34 x10E6/uL — ABNORMAL LOW (ref 3.77–5.28)
RDW: 13.3 % (ref 11.7–15.4)
WBC: 8.3 10*3/uL (ref 3.4–10.8)

## 2023-04-03 LAB — COMPREHENSIVE METABOLIC PANEL
ALT: 18 [IU]/L (ref 0–32)
AST: 26 [IU]/L (ref 0–40)
Albumin: 4.1 g/dL (ref 3.7–4.7)
Alkaline Phosphatase: 51 [IU]/L (ref 44–121)
BUN/Creatinine Ratio: 19 (ref 12–28)
BUN: 19 mg/dL (ref 8–27)
Bilirubin Total: 0.3 mg/dL (ref 0.0–1.2)
CO2: 21 mmol/L (ref 20–29)
Calcium: 9.6 mg/dL (ref 8.7–10.3)
Chloride: 100 mmol/L (ref 96–106)
Creatinine, Ser: 1.02 mg/dL — ABNORMAL HIGH (ref 0.57–1.00)
Globulin, Total: 2.4 g/dL (ref 1.5–4.5)
Glucose: 102 mg/dL — ABNORMAL HIGH (ref 70–99)
Potassium: 4.9 mmol/L (ref 3.5–5.2)
Sodium: 138 mmol/L (ref 134–144)
Total Protein: 6.5 g/dL (ref 6.0–8.5)
eGFR: 54 mL/min/{1.73_m2} — ABNORMAL LOW (ref >59)

## 2023-04-03 LAB — TSH: TSH: 1.08 u[IU]/mL (ref 0.450–4.500)

## 2023-04-03 LAB — IRON,TIBC AND FERRITIN PANEL
Ferritin: 139 ng/mL (ref 15–150)
Iron Saturation: 23 % (ref 15–55)
Iron: 70 ug/dL (ref 27–139)
Total Iron Binding Capacity: 301 ug/dL (ref 250–450)
UIBC: 231 ug/dL (ref 118–369)

## 2023-04-05 LAB — URINE CULTURE

## 2023-04-09 ENCOUNTER — Ambulatory Visit: Payer: Medicare Other

## 2023-04-17 ENCOUNTER — Ambulatory Visit: Payer: Medicare Other

## 2023-04-17 DIAGNOSIS — N3001 Acute cystitis with hematuria: Secondary | ICD-10-CM | POA: Diagnosis not present

## 2023-04-17 LAB — POCT URINALYSIS DIP (CLINITEK)
Bilirubin, UA: NEGATIVE
Blood, UA: NEGATIVE
Glucose, UA: NEGATIVE mg/dL
Ketones, POC UA: NEGATIVE mg/dL
Nitrite, UA: NEGATIVE
POC PROTEIN,UA: NEGATIVE
Spec Grav, UA: 1.015 (ref 1.010–1.025)
Urobilinogen, UA: NEGATIVE U/dL — AB
pH, UA: 6 (ref 5.0–8.0)

## 2023-04-17 NOTE — Progress Notes (Signed)
Patient's daughter dropped off urine sample to be rechecked after two weeks of being on antibiotics and it showed 1 plus leuks. Consulted with Dr. Sedalia Muta and was told to re-culture the urine and wait for results. Informed the patient's daughter and told her we would call her once we received results. The patient's daughter also stated that she had dropped off Hemoccult cards with our front staff Person Misty for the patient, and then stated that she had not heard any results back from those. I checked for results and did not see any nor was the patient charged for the cards neither. Also looked for the cards in the lab and they were no where to be found. Informed the patient's daughter of the findings of the cards and that we could re-do them and recheck them because the patient was not charged for them and she stated that she would just wait till patient's follow up appointment in February because she states that she has not seen any blood in the patient's poop and that the patient is very private.

## 2023-04-20 LAB — URINE CULTURE

## 2023-04-21 ENCOUNTER — Other Ambulatory Visit: Payer: Self-pay

## 2023-04-21 MED ORDER — CIPROFLOXACIN HCL 250 MG PO TABS
250.0000 mg | ORAL_TABLET | Freq: Two times a day (BID) | ORAL | 0 refills | Status: AC
Start: 2023-04-21 — End: 2023-04-28

## 2023-04-29 ENCOUNTER — Telehealth: Payer: Self-pay

## 2023-04-29 NOTE — Telephone Encounter (Signed)
 Copied from CRM [id number]. Topic: Clinical - Medical Advice >> Apr 29, 2023 11:16 AM Powell HERO wrote: Reason for CRM: Patient has been taking her antibiotic for her UTI and her daughter is wanting to know if she needs to complete another UA and if they can come into the office to pick up a cup to do so. Patient is okay with responding through mychart or phone is fine. Please use patients primary phone number to reach [facility], patients daughter to let them know how to proceed.

## 2023-04-29 NOTE — Telephone Encounter (Signed)
 Called patient daughter Carollee Herter) She stated her mom has completed her second rounds of antibiotics and wanted to know if she needed to bring a urine sample by to be rechecked for UTI.  Made her aware, she can drop off a urine, and we can run a UA.

## 2023-04-29 NOTE — Telephone Encounter (Signed)
   Patient: Karen Briggs  DOB: January 24, 1935  MRN: [id number]  Prolia  benefits verified by [employer] on: 04/25/2023 Coverage Available: Buy & Bill Prior authorization NOT REQUIRED Deductible: $0 of $257 met OOP Cost: $0 Admin Fee: $0 Benefit Details: Prolia  and administration will be subject to a $257.00 deductible ($0.00 met) and 20.0% coinsurance.  For the Secondary MD Purchase access option, this is a Medicare Supplement Plan F and it covers the Medicare Part B deductible, co-insurance and 100% of the excess charges  Patient will be due for her next Prolia  injection 05/19/2023.  Suzen CHRISTELLA Sharps, LPN

## 2023-04-30 ENCOUNTER — Ambulatory Visit (INDEPENDENT_AMBULATORY_CARE_PROVIDER_SITE_OTHER): Payer: Medicare Other

## 2023-04-30 DIAGNOSIS — N3001 Acute cystitis with hematuria: Secondary | ICD-10-CM | POA: Diagnosis not present

## 2023-04-30 LAB — POCT URINALYSIS DIP (CLINITEK)
Blood, UA: NEGATIVE
Glucose, UA: NEGATIVE mg/dL
Ketones, POC UA: NEGATIVE mg/dL
Leukocytes, UA: NEGATIVE
Nitrite, UA: NEGATIVE
POC PROTEIN,UA: NEGATIVE
Spec Grav, UA: 1.02 (ref 1.010–1.025)
Urobilinogen, UA: 0.2 U/dL
pH, UA: 5.5 (ref 5.0–8.0)

## 2023-04-30 NOTE — Progress Notes (Signed)
 Patient's daughter dropped off urine to be rechecked. Ran a UA, had Gerre Pebbles, PA-C to review it. Per Kennon Rounds, urine looked good, no further treatment. Called patient's daughter, she is aware and had no further questions or concerns.

## 2023-06-02 NOTE — Progress Notes (Signed)
 Subjective:  Patient ID: Karen Briggs, female    DOB: [birth year]  Age: 89 y.o. MRN: [id number]  Chief Complaint  Patient presents with   Medical Management of Chronic Issues    HPI  The patient, with a history of hypothyroidism, hyperlipidemia, osteoporosis, gastroesophageal reflux disease (GERD), and depression/anxiety, presents with a chief complaint of a persistent runny nose. The patient's daughter reports that the patient has had a cold recently, but it is difficult to differentiate from the patient's chronic runny nose. The patient denies fever, chills, earaches, sore throat, and headaches. The patient has been managing the runny nose with over-the-counter allergy medication, specifically Allegra, which seems to provide some relief.  The patient's daughter also reports concerns about the patient's memory, noting a decline since a fall several months ago. The patient is able to dress and bathe independently, but the daughter has taken over medication management due to the patient's forgetfulness. The patient denies feeling sad or nervous and reports a good appetite.  The patient also has a history of recurrent urinary tract infections (UTIs), with the most recent episode occurring in December. The patient's daughter requests a urine test to rule out another UTI, although the patient denies any bladder symptoms or confusion.  Hypothyroidism: She is taking levothyroxine 75 mcg daily.  Diagnosed 20 years ago.    Hyperlipidemia: Takes Simvastatin 40 mg daily. Eats low cholesterol diet and and a little exercises.  Tolerating medicine.   GERD: omeprazole 20 mg daily.   Osteoporosis: on prolia.   GAD/Depression: on zoloft 25 mg daily. Doing well.   HYPERTENSION: on metoprolol 25 mg twice daily and supposed to have been taking losartan 25 mg daily.  Daughter states patient has still been taking lisinopril and has not been taking losartan.        02/20/2023    2:03 PM 11/15/2022     9:27 AM 07/29/2022    8:57 AM 07/29/2022    8:54 AM 01/25/2022    3:17 PM  Depression screen PHQ 2/9  Decreased Interest 0 0 0 0 0  Down, Depressed, Hopeless 0 0 0 0 0  PHQ - 2 Score 0 0 0 0 0  Altered sleeping  0 0  0  Tired, decreased energy  0 0  0  Change in appetite  0 0  0  Feeling bad or failure about yourself   0 0  0  Trouble concentrating  0 0  0  Moving slowly or fidgety/restless  0 0  0  Suicidal thoughts  0 0  0  PHQ-9 Score  0 0  0  Difficult doing work/chores   Not difficult at all  Not difficult at all        02/20/2023    2:03 PM  Fall Risk   Falls in the past year? 0  Number falls in past yr: 0  Injury with Fall? 0  Risk for fall due to : No Fall Risks  Follow up Falls evaluation completed;Education provided    Patient Care Team: Blane Ohara, MD as PCP - General (Internal Medicine) Georgeanna Lea, MD as Consulting Physician (Cardiology)   Review of Systems  Constitutional:  Negative for chills, fatigue and fever.  HENT:  Positive for rhinorrhea. Negative for congestion, ear pain and sore throat.   Respiratory:  Negative for cough and shortness of breath.   Cardiovascular:  Negative for chest pain.  Gastrointestinal:  Negative for abdominal pain, constipation, diarrhea, nausea and vomiting.  Genitourinary:  Negative for dysuria and urgency.  Musculoskeletal:  Negative for back pain and myalgias.  Neurological:  Negative for dizziness, weakness, light-headedness and headaches.  Psychiatric/Behavioral:  Negative for dysphoric mood. The patient is not nervous/anxious.     Current Outpatient Medications on File Prior to Visit  Medication Sig Dispense Refill   acetaminophen (TYLENOL) 500 MG tablet Take 500 mg by mouth as needed for moderate pain.     aspirin EC 81 MG tablet Take 81 mg by mouth daily.     denosumab (PROLIA) 60 MG/ML SOSY injection Inject 60 mg into the skin every 6 (six) months.     levothyroxine (SYNTHROID) 75 MCG tablet TAKE 1 TABLET  EVERY DAY BEFORE BREAKFAST (Patient taking differently: Take 75 mcg by mouth daily before breakfast.) 90 tablet 3   losartan (COZAAR) 25 MG tablet Take 1 tablet (25 mg total) by mouth daily. 90 tablet 1   metoprolol tartrate (LOPRESSOR) 25 MG tablet TAKE 1 TABLET TWICE DAILY (Patient taking differently: Take 25 mg by mouth 2 (two) times daily.) 180 tablet 3   Multiple Vitamin (MULTIVITAMIN) capsule Take 1 capsule by mouth daily.     omeprazole (PRILOSEC) 20 MG capsule TAKE 1 CAPSULE EVERY DAY (Patient taking differently: Take 20 mg by mouth daily.) 90 capsule 3   simvastatin (ZOCOR) 40 MG tablet TAKE 1 TABLET EVERY DAY (Patient taking differently: Take 40 mg by mouth daily at 6 PM.) 90 tablet 3   SODIUM FLUORIDE 5000 PPM 1.1 % GEL dental gel Place 1 Application onto teeth at bedtime.     No current facility-administered medications on file prior to visit.   Past Medical History:  Diagnosis Date   BMI 26.0-26.9,adult 09/16/2019   Chronic kidney disease, stage 3a (HCC) 05/31/2019   Essential hypertension 10/17/2017   Iron deficiency anemia 05/31/2019   Kidney disease    Stage 3   Mixed hyperlipidemia 10/17/2017   Osteoporosis 12/07/2019   Other specified hypothyroidism 10/17/2017   Palpitations 10/17/2017   Plantar wart 09/15/2019   Vitamin D insufficiency 05/31/2019   Past Surgical History:  Procedure Laterality Date   CHOLECYSTECTOMY  2001    Family History  Problem Relation Age of Onset   Heart attack Mother    Leukemia Father    Social History   Socioeconomic History   Marital status: Widowed    Spouse name: Not on file   Number of children: 2   Years of education: Not on file   Highest education level: Not on file  Occupational History   Occupation: Retired  Tobacco Use   Smoking status: Never   Smokeless tobacco: Never  Vaping Use   Vaping status: Never Used  Substance and Sexual Activity   Alcohol use: Not Currently   Drug use: Never   Sexual activity: Not Currently   Other Topics Concern   Not on file  Social History Narrative   Daughter lives next door and packs medications for her   Social Drivers of Health   Financial Resource Strain: Low Risk  (02/20/2023)   Overall Financial Resource Strain (CARDIA)    Difficulty of Paying Living Expenses: Not hard at all  Food Insecurity: No Food Insecurity (02/20/2023)   Hunger Vital Sign    Worried About Running Out of Food in the Last Year: Never true    Ran Out of Food in the Last Year: Never true  Transportation Needs: No Transportation Needs (02/20/2023)   PRAPARE - Administrator, Civil Service (Medical): No  Lack of Transportation (Non-Medical): No  Physical Activity: Insufficiently Active (02/20/2023)   Exercise Vital Sign    Days of Exercise per Week: 4 days    Minutes of Exercise per Session: 20 min  Stress: No Stress Concern Present (02/20/2023)   [employer] of Occupational Health - Occupational Stress Questionnaire    Feeling of Stress : Only a little  Social Connections: Socially Isolated (02/20/2023)   Social Connection and Isolation Panel [NHANES]    Frequency of Communication with Friends and Family: Three times a week    Frequency of Social Gatherings with Friends and Family: Three times a week    Attends Religious Services: Never    Active Member of Clubs or Organizations: No    Attends Banker Meetings: Never    Marital Status: Widowed    Objective:  BP 132/70   Pulse 69   Temp (!) 97.2 F (36.2 C)   Ht 5\' 5"  (1.651 m)   Wt 131 lb (59.4 kg)   LMP  (LMP Unknown)   SpO2 100%   BMI 21.80 kg/m      06/03/2023    9:32 AM 04/02/2023   10:30 AM 03/02/2023    6:34 PM  BP/Weight  Systolic BP 132 132 138  Diastolic BP 70 68 65  Wt. (Lbs) 131 126   BMI 21.8 kg/m2 20.97 kg/m2     Physical Exam Vitals reviewed.  Constitutional:      Appearance: Normal appearance. She is normal weight.  HENT:     Right Ear: There is impacted cerumen.      Left Ear: There is impacted cerumen.     Nose: Congestion and rhinorrhea present.     Mouth/Throat:     Pharynx: No oropharyngeal exudate or posterior oropharyngeal erythema.  Neck:     Vascular: No carotid bruit.  Cardiovascular:     Rate and Rhythm: Normal rate and regular rhythm.     Heart sounds: Normal heart sounds.  Pulmonary:     Effort: Pulmonary effort is normal. No respiratory distress.     Breath sounds: Normal breath sounds.  Abdominal:     General: Abdomen is flat. Bowel sounds are normal.     Palpations: Abdomen is soft.     Tenderness: There is no abdominal tenderness.  Lymphadenopathy:     Cervical: No cervical adenopathy.  Neurological:     Mental Status: She is alert.  Psychiatric:        Mood and Affect: Mood normal.        Behavior: Behavior normal.     Diabetic Foot Exam - Simple   No data filed      Lab Results  Component Value Date   WBC 8.4 06/03/2023   HGB 11.1 06/03/2023   HCT 33.9 (L) 06/03/2023   PLT 333 06/03/2023   GLUCOSE 90 06/03/2023   CHOL 182 06/03/2023   TRIG 104 06/03/2023   HDL 84 06/03/2023   LDLCALC 80 06/03/2023   ALT 17 06/03/2023   AST 29 06/03/2023   NA 133 (L) 06/03/2023   K 4.6 06/03/2023   CL 96 06/03/2023   CREATININE 1.13 (H) 06/03/2023   BUN 20 06/03/2023   CO2 21 06/03/2023   TSH 1.080 04/02/2023      Assessment & Plan:    Acquired hypothyroidism Assessment & Plan: On thyroid medication. Recent change in timing of medication administration. -Check thyroid function tests to assess current control.   Mixed hyperlipidemia Assessment & Plan: Well controlled.  No changes to medicines. Continue simvastatin 40 mg daily.  Continue to work on eating a healthy diet and exercise.  Labs drawn today.    Orders: -     Lipid panel  Hypertensive renal disease, benign, stage 1-4 or unspecified chronic kidney disease Assessment & Plan: The current medical regimen is effective;  continue present plan and  medications. Continue  metoprolol 25 mg twice daily. Check labs.   Orders: -     CBC with Differential/Platelet -     Comprehensive metabolic panel  Mild dementia without behavioral disturbance, psychotic disturbance, mood disturbance, or anxiety, unspecified dementia type [facility]) Assessment & Plan: Family reports worsening memory. No prior imaging or treatment for memory impairment. -Start Aricept 5mg  at bedtime for suspected Alzheimer's dementia. Increase to 10mg  after 4 weeks if tolerated. -Consider referral to [facility] for ear wax removal. -Repeat memory testing.  Orders: -     Donepezil HCl; Take 1 tablet (5 mg total) by mouth at bedtime.  Dispense: 90 tablet; Refill: 0  GAD (generalized anxiety disorder) Assessment & Plan: On Sertraline. Denies current feelings of sadness or nervousness. -Continue current treatment.  Orders: -     Sertraline HCl; Take 1 tablet (25 mg total) by mouth daily.  Dispense: 90 tablet; Refill: 1  Gastroesophageal reflux disease with esophagitis without hemorrhage Assessment & Plan: On Omeprazole. -Continue current treatment.   Hearing loss of left ear due to cerumen impaction Assessment & Plan: Referring to ENT  Orders: -     Ambulatory referral to ENT  Chronic kidney disease, stage 3a (HCC) Assessment & Plan: Stable.     Assessment and Plan Allergic Rhinitis Chronic runny nose, possibly exacerbated by recent cold. Currently on Allegra. -Consider adding Flonase nasal spray for additional symptom control.  Hyperlipidemia On Simvastatin. Reports high sugar intake. -Encourage moderation in sugar intake.  Hypothyroidism On thyroid medication. Recent change in timing of medication administration. -Check thyroid function tests to assess current control.  Osteoporosis On Prolia. -Continue current treatment.  Gastroesophageal Reflux Disease On Omeprazole. -Continue current treatment.  Depression/Anxiety On Sertraline.  Denies current feelings of sadness or nervousness. -Continue current treatment.  Possible Urinary Tract Infection History of UTI in December. No current symptoms reported. -Collect urine sample for urinalysis to rule out UTI.    Fall Risk History of fall in October/November. Currently able to perform ADLs independently. -Continue monitoring.  Meds ordered this encounter  Medications   sertraline (ZOLOFT) 25 MG tablet    Sig: Take 1 tablet (25 mg total) by mouth daily.    Dispense:  90 tablet    Refill:  1   donepezil (ARICEPT) 5 MG tablet    Sig: Take 1 tablet (5 mg total) by mouth at bedtime.    Dispense:  90 tablet    Refill:  0    Orders Placed This Encounter  Procedures   CBC with Differential/Platelet   Comprehensive metabolic panel   Lipid panel   Ambulatory referral to ENT     Follow-up: Return in about 3 months (around 08/31/2023) for chronic follow up.   I,Marla I Leal-Borjas,acting as a scribe for Blane Ohara, MD.,have documented all relevant documentation on the behalf of Blane Ohara, MD,as directed by  Blane Ohara, MD while in the presence of Blane Ohara, MD.   An After Visit Summary was printed and given to the patient.  I attest that I have reviewed this visit and agree with the plan scribed by my staff.   Blane Ohara,  MD [facility] [phone]

## 2023-06-03 ENCOUNTER — Ambulatory Visit (INDEPENDENT_AMBULATORY_CARE_PROVIDER_SITE_OTHER): Payer: Medicare Other | Admitting: Family Medicine

## 2023-06-03 ENCOUNTER — Encounter: Payer: Self-pay | Admitting: Family Medicine

## 2023-06-03 VITALS — BP 132/70 | HR 69 | Temp 97.2°F | Ht 65.0 in | Wt 131.0 lb

## 2023-06-03 DIAGNOSIS — N1831 Chronic kidney disease, stage 3a: Secondary | ICD-10-CM | POA: Diagnosis not present

## 2023-06-03 DIAGNOSIS — K2100 Gastro-esophageal reflux disease with esophagitis, without bleeding: Secondary | ICD-10-CM

## 2023-06-03 DIAGNOSIS — R413 Other amnesia: Secondary | ICD-10-CM

## 2023-06-03 DIAGNOSIS — F03A0 Unspecified dementia, mild, without behavioral disturbance, psychotic disturbance, mood disturbance, and anxiety: Secondary | ICD-10-CM | POA: Diagnosis not present

## 2023-06-03 DIAGNOSIS — I129 Hypertensive chronic kidney disease with stage 1 through stage 4 chronic kidney disease, or unspecified chronic kidney disease: Secondary | ICD-10-CM

## 2023-06-03 DIAGNOSIS — E039 Hypothyroidism, unspecified: Secondary | ICD-10-CM

## 2023-06-03 DIAGNOSIS — E782 Mixed hyperlipidemia: Secondary | ICD-10-CM | POA: Diagnosis not present

## 2023-06-03 DIAGNOSIS — H6122 Impacted cerumen, left ear: Secondary | ICD-10-CM

## 2023-06-03 DIAGNOSIS — F411 Generalized anxiety disorder: Secondary | ICD-10-CM

## 2023-06-03 MED ORDER — DONEPEZIL HCL 5 MG PO TABS: 5.0000 mg | ORAL_TABLET | Freq: Every day | ORAL | 0 refills | Status: AC

## 2023-06-03 MED ORDER — SERTRALINE HCL 25 MG PO TABS: 25.0000 mg | ORAL_TABLET | Freq: Every day | ORAL | 1 refills | Status: AC

## 2023-06-03 NOTE — Patient Instructions (Addendum)
VISIT SUMMARY:  During today's visit, we discussed your persistent runny nose, memory concerns, and overall health management. We reviewed your current medications and made some adjustments to better manage your symptoms and conditions.  YOUR PLAN:  -ALLERGIC RHINITIS: Allergic rhinitis is an inflammation of the nasal passages usually caused by allergies. You are currently taking Allegra, and we recommend adding Flonase nasal spray to help control your symptoms further.  -HYPERLIPIDEMIA: Hyperlipidemia is a condition where there are high levels of fats (lipids) in your blood. You are on Simvastatin for this, and we encourage you to moderate your sugar intake to help manage this condition.  -HYPOTHYROIDISM: Hypothyroidism is a condition where your thyroid gland does not produce enough thyroid hormone. You are on thyroid medication, and we will check your thyroid function tests to ensure your current treatment is effective.  -OSTEOPOROSIS: Osteoporosis is a condition that weakens bones, making them fragile and more likely to break. You are currently on Prolia, and we will continue this treatment.  -GASTROESOPHAGEAL REFLUX DISEASE (GERD): GERD is a digestive disorder where stomach acid irritates the food pipe lining. You are on Omeprazole, and we will continue this treatment.  -DEPRESSION/ANXIETY: You are currently taking Sertraline for depression and anxiety and have reported no current feelings of sadness or nervousness. We will continue this treatment.  -POSSIBLE URINARY TRACT INFECTION: A urinary tract infection (UTI) is an infection in any part of your urinary system. Although you have no current symptoms, we will collect a urine sample to rule out a UTI.  -MEMORY IMPAIRMENT: Memory impairment can be a sign of conditions like Alzheimer's dementia. We will start you on Aricept 5mg  at bedtime and increase to 10mg  after 4 weeks if tolerated. We will also send a referral to [facility] for ear  wax removal.  -FALL RISK: Given your history of a fall, we will continue to monitor your ability to perform daily activities independently.  INSTRUCTIONS:  1. Start using Flonase nasal spray as directed for your runny nose. 2. Moderate your sugar intake to help manage your hyperlipidemia. 3. We will check your thyroid function tests to ensure your hypothyroidism is well-controlled. 4. Continue taking Prolia for osteoporosis and Omeprazole for GERD as prescribed. 5. Continue taking Sertraline for depression and anxiety as prescribed. 6. Provide a urine sample for urinalysis to rule out a UTI. 7. Start Aricept 5mg  at bedtime for memory impairment and increase to 10mg  after 4 weeks if tolerated. 8. Send referral to [facility] for ear wax removal. 9. We will repeat memory testing to monitor your condition. 10. Continue to monitor your ability to perform daily activities independently.

## 2023-06-04 ENCOUNTER — Encounter: Payer: Self-pay | Admitting: Family Medicine

## 2023-06-04 ENCOUNTER — Telehealth: Payer: Self-pay

## 2023-06-04 LAB — CBC WITH DIFFERENTIAL/PLATELET
Basophils Absolute: 0.1 10*3/uL (ref 0.0–0.2)
Basos: 1 %
EOS (ABSOLUTE): 0.1 10*3/uL (ref 0.0–0.4)
Eos: 2 %
Hematocrit: 33.9 % — ABNORMAL LOW (ref 34.0–46.6)
Hemoglobin: 11.1 g/dL (ref 11.1–15.9)
Immature Grans (Abs): 0 10*3/uL (ref 0.0–0.1)
Immature Granulocytes: 0 %
Lymphocytes Absolute: 3.3 10*3/uL — ABNORMAL HIGH (ref 0.7–3.1)
Lymphs: 39 %
MCH: 31 pg (ref 26.6–33.0)
MCHC: 32.7 g/dL (ref 31.5–35.7)
MCV: 95 fL (ref 79–97)
Monocytes Absolute: 0.9 10*3/uL (ref 0.1–0.9)
Monocytes: 10 %
Neutrophils Absolute: 4 10*3/uL (ref 1.4–7.0)
Neutrophils: 48 %
Platelets: 333 10*3/uL (ref 150–450)
RBC: 3.58 x10E6/uL — ABNORMAL LOW (ref 3.77–5.28)
RDW: 12.5 % (ref 11.7–15.4)
WBC: 8.4 10*3/uL (ref 3.4–10.8)

## 2023-06-04 LAB — COMPREHENSIVE METABOLIC PANEL
ALT: 17 [IU]/L (ref 0–32)
AST: 29 [IU]/L (ref 0–40)
Albumin: 4.7 g/dL (ref 3.7–4.7)
Alkaline Phosphatase: 61 [IU]/L (ref 44–121)
BUN/Creatinine Ratio: 18 (ref 12–28)
BUN: 20 mg/dL (ref 8–27)
Bilirubin Total: 0.3 mg/dL (ref 0.0–1.2)
CO2: 21 mmol/L (ref 20–29)
Calcium: 9.9 mg/dL (ref 8.7–10.3)
Chloride: 96 mmol/L (ref 96–106)
Creatinine, Ser: 1.13 mg/dL — ABNORMAL HIGH (ref 0.57–1.00)
Globulin, Total: 2.8 g/dL (ref 1.5–4.5)
Glucose: 90 mg/dL (ref 70–99)
Potassium: 4.6 mmol/L (ref 3.5–5.2)
Sodium: 133 mmol/L — ABNORMAL LOW (ref 134–144)
Total Protein: 7.5 g/dL (ref 6.0–8.5)
eGFR: 47 mL/min/{1.73_m2} — ABNORMAL LOW (ref >59)

## 2023-06-04 LAB — LIPID PANEL
Chol/HDL Ratio: 2.2 {ratio} (ref 0.0–4.4)
Cholesterol, Total: 182 mg/dL (ref 100–199)
HDL: 84 mg/dL (ref >39)
LDL Chol Calc (NIH): 80 mg/dL (ref 0–99)
Triglycerides: 104 mg/dL (ref 0–149)
VLDL Cholesterol Cal: 18 mg/dL (ref 5–40)

## 2023-06-04 NOTE — Telephone Encounter (Signed)
Copied from CRM [id number]. Topic: Appointments - Scheduling Inquiry for Clinic >> Jun 02, 2023  9:07 AM Antwanette L wrote: Reason for CRM: Patient daughter Carollee Herter) called in to see if we can r/s her mother Prolia shot for another day. The patient has an appt for tomorrow and she still wants to get her blood work done, but r/s the Prolia shot. The patient daughter is asking for a nurse to call her at [phone].

## 2023-06-04 NOTE — Telephone Encounter (Signed)
Attempted to reach patient's daughter in regards to Takyra's Prolia shot.  She can schedule whenever it is convenient for her.

## 2023-06-06 ENCOUNTER — Ambulatory Visit: Payer: Medicare Other

## 2023-06-06 DIAGNOSIS — N390 Urinary tract infection, site not specified: Secondary | ICD-10-CM | POA: Diagnosis not present

## 2023-06-06 DIAGNOSIS — M810 Age-related osteoporosis without current pathological fracture: Secondary | ICD-10-CM | POA: Diagnosis not present

## 2023-06-06 LAB — POCT URINALYSIS DIP (CLINITEK)
Bilirubin, UA: NEGATIVE
Blood, UA: NEGATIVE
Glucose, UA: NEGATIVE mg/dL
Ketones, POC UA: NEGATIVE mg/dL
Nitrite, UA: NEGATIVE
Spec Grav, UA: 1.01 (ref 1.010–1.025)
Urobilinogen, UA: 0.2 U/dL
pH, UA: 8.5 — AB (ref 5.0–8.0)

## 2023-06-06 MED ORDER — DENOSUMAB 60 MG/ML ~~LOC~~ SOSY
60.0000 mg | PREFILLED_SYRINGE | Freq: Once | SUBCUTANEOUS | Status: AC
  Administered 2023-06-06: 60 mg via SUBCUTANEOUS

## 2023-06-06 MED ORDER — DENOSUMAB 60 MG/ML ~~LOC~~ SOSY: 60.0000 mg | PREFILLED_SYRINGE | Freq: Once | SUBCUTANEOUS | Status: DC

## 2023-06-06 NOTE — Progress Notes (Signed)
Patient is here for Prolia injection. She tolerates well the injection.  Patient's daughter was concerned that patient would have UTI because she did not present any symptoms last time that she had an infection. She asked if she can be check. Langley Gauss, PA-C recommends UA and send the urine for culture and wait for results before to send antibiotic. UA showed trace of Leukocytes.  Patient's daughter verbalized to understand.

## 2023-06-07 DIAGNOSIS — H6122 Impacted cerumen, left ear: Secondary | ICD-10-CM | POA: Insufficient documentation

## 2023-06-07 NOTE — Assessment & Plan Note (Signed)
 Stable

## 2023-06-07 NOTE — Assessment & Plan Note (Signed)
 On Sertraline. Denies current feelings of sadness or nervousness. -Continue current treatment.

## 2023-06-07 NOTE — Assessment & Plan Note (Signed)
 Referring to ENT.

## 2023-06-07 NOTE — Assessment & Plan Note (Signed)
 On thyroid medication. Recent change in timing of medication administration. -Check thyroid function tests to assess current control.

## 2023-06-07 NOTE — Assessment & Plan Note (Signed)
 Family reports worsening memory. No prior imaging or treatment for memory impairment. -Start Aricept 5mg  at bedtime for suspected Alzheimer's dementia. Increase to 10mg  after 4 weeks if tolerated. -Consider referral to [facility] for ear wax removal. -Repeat memory testing.

## 2023-06-07 NOTE — Assessment & Plan Note (Signed)
Well controlled.  No changes to medicines. Continue simvastatin 40 mg daily  Continue to work on eating a healthy diet and exercise.  Labs drawn today.   

## 2023-06-07 NOTE — Assessment & Plan Note (Signed)
 The current medical regimen is effective;  continue present plan and medications. Continue  metoprolol 25 mg twice daily. Check labs.

## 2023-06-07 NOTE — Assessment & Plan Note (Signed)
 On Omeprazole. -Continue current treatment.

## 2023-06-08 LAB — URINE CULTURE

## 2023-06-10 ENCOUNTER — Encounter: Payer: Self-pay | Admitting: Physician Assistant

## 2023-06-17 ENCOUNTER — Ambulatory Visit (INDEPENDENT_AMBULATORY_CARE_PROVIDER_SITE_OTHER): Payer: Medicare Other

## 2023-06-17 DIAGNOSIS — Z111 Encounter for screening for respiratory tuberculosis: Secondary | ICD-10-CM | POA: Diagnosis not present

## 2023-06-17 NOTE — Progress Notes (Signed)
 Patient presented for TB skin test. Test administered. Patient to follow up in 48 hours.

## 2023-06-18 ENCOUNTER — Ambulatory Visit: Payer: Medicare Other | Admitting: Podiatry

## 2023-06-18 ENCOUNTER — Other Ambulatory Visit: Payer: Self-pay | Admitting: Family Medicine

## 2023-06-18 DIAGNOSIS — M79674 Pain in right toe(s): Secondary | ICD-10-CM

## 2023-06-18 DIAGNOSIS — I739 Peripheral vascular disease, unspecified: Secondary | ICD-10-CM | POA: Diagnosis not present

## 2023-06-18 DIAGNOSIS — B351 Tinea unguium: Secondary | ICD-10-CM | POA: Diagnosis not present

## 2023-06-18 DIAGNOSIS — L84 Corns and callosities: Secondary | ICD-10-CM

## 2023-06-18 DIAGNOSIS — M79675 Pain in left toe(s): Secondary | ICD-10-CM | POA: Diagnosis not present

## 2023-06-18 NOTE — Progress Notes (Unsigned)
       Subjective:  Patient ID: Karen Briggs, female    DOB: [birth year],  MRN: [id number]  Karen Briggs presents to clinic today for:  Chief Complaint  Patient presents with   [facility]    RFC with callous on 5th toes. Not diabetic and takes ASA 81. Daughter, Karen Briggs, is with her today and wants to know if we can get her in sooner then 3 months.    Patient notes nails are thick and elongated, causing pain in shoe gear when ambulating.  [name]  PCP is Cox, Fritzi Mandes, MD. last seen on 06/03/2023  Past Medical History:  Diagnosis Date   BMI 26.0-26.9,adult 09/16/2019   Chronic kidney disease, stage 3a (HCC) 05/31/2019   Essential hypertension 10/17/2017   Iron deficiency anemia 05/31/2019   Kidney disease    Stage 3   Mixed hyperlipidemia 10/17/2017   Osteoporosis 12/07/2019   Other specified hypothyroidism 10/17/2017   Palpitations 10/17/2017   Plantar wart 09/15/2019   Vitamin D insufficiency 05/31/2019    Allergies  Allergen Reactions   Levofloxacin Palpitations   Alendronate     diarrhea   Sulfa Antibiotics Nausea Only    Objective:  Karen Briggs is a pleasant 89 y.o. female in NAD. AAO x 3.  Vascular Examination: Patient has palpable DP pulse, absent PT pulse bilateral.  Delayed capillary refill bilateral toes.  Sparse digital hair bilateral.  Proximal to distal cooling WNL bilateral.    Dermatological Examination: Interspaces are clear with no open lesions noted bilateral.  Skin is shiny and atrophic bilateral.  Nails are 3-12mm thick, with yellowish/brown discoloration, subungual debris and distal onycholysis x10.  There is pain with compression of nails x10.  There are hyperkeratotic lesions noted on the dorsal lateral aspect of the left fifth toe at the PIP joint.  Patient qualifies for at-risk foot care because of PVD.  Assessment/Plan: 1. Pain due to onychomycosis of toenails of both feet   2. Corn of toe   3. PVD (peripheral  vascular disease) (HCC)     Mycotic nails x10 were sharply debrided with sterile nail nippers and power debriding burr to decrease bulk and length.  Hyperkeratotic lesion on dorsal lateral aspect of the left fifth toe at the proximal interphalangeal joint was shaved with #312 blade.   Return in about 10 weeks (around 08/27/2023) for RFC.   Clerance Lav, DPM, FACFAS [facility]     [address] [place], [place] [address]                Office [phone]  Fax [phone]

## 2023-06-19 ENCOUNTER — Ambulatory Visit: Payer: Medicare Other

## 2023-06-19 LAB — TB SKIN TEST
Induration: 0 mm
TB Skin Test: NEGATIVE

## 2023-06-19 NOTE — Progress Notes (Signed)
 Patient came in for TB screening read and screening was negative.

## 2023-07-08 DIAGNOSIS — H6123 Impacted cerumen, bilateral: Secondary | ICD-10-CM | POA: Diagnosis not present

## 2023-07-09 DIAGNOSIS — I129 Hypertensive chronic kidney disease with stage 1 through stage 4 chronic kidney disease, or unspecified chronic kidney disease: Secondary | ICD-10-CM | POA: Diagnosis not present

## 2023-07-09 DIAGNOSIS — F0390 Unspecified dementia without behavioral disturbance: Secondary | ICD-10-CM | POA: Diagnosis not present

## 2023-07-09 DIAGNOSIS — M810 Age-related osteoporosis without current pathological fracture: Secondary | ICD-10-CM | POA: Diagnosis not present

## 2023-07-09 DIAGNOSIS — M1990 Unspecified osteoarthritis, unspecified site: Secondary | ICD-10-CM | POA: Diagnosis not present

## 2023-07-09 DIAGNOSIS — E039 Hypothyroidism, unspecified: Secondary | ICD-10-CM | POA: Diagnosis not present

## 2023-07-09 DIAGNOSIS — K219 Gastro-esophageal reflux disease without esophagitis: Secondary | ICD-10-CM | POA: Diagnosis not present

## 2023-07-09 DIAGNOSIS — M6281 Muscle weakness (generalized): Secondary | ICD-10-CM | POA: Diagnosis not present

## 2023-07-09 DIAGNOSIS — N183 Chronic kidney disease, stage 3 unspecified: Secondary | ICD-10-CM | POA: Diagnosis not present

## 2023-07-09 DIAGNOSIS — E785 Hyperlipidemia, unspecified: Secondary | ICD-10-CM | POA: Diagnosis not present

## 2023-07-10 DIAGNOSIS — J302 Other seasonal allergic rhinitis: Secondary | ICD-10-CM | POA: Diagnosis not present

## 2023-07-10 DIAGNOSIS — N189 Chronic kidney disease, unspecified: Secondary | ICD-10-CM | POA: Diagnosis not present

## 2023-07-10 DIAGNOSIS — M810 Age-related osteoporosis without current pathological fracture: Secondary | ICD-10-CM | POA: Diagnosis not present

## 2023-07-10 DIAGNOSIS — Z131 Encounter for screening for diabetes mellitus: Secondary | ICD-10-CM | POA: Diagnosis not present

## 2023-07-10 DIAGNOSIS — E785 Hyperlipidemia, unspecified: Secondary | ICD-10-CM | POA: Diagnosis not present

## 2023-07-10 DIAGNOSIS — F0283 Dementia in other diseases classified elsewhere, unspecified severity, with mood disturbance: Secondary | ICD-10-CM | POA: Diagnosis not present

## 2023-07-10 DIAGNOSIS — F0284 Dementia in other diseases classified elsewhere, unspecified severity, with anxiety: Secondary | ICD-10-CM | POA: Diagnosis not present

## 2023-07-10 DIAGNOSIS — I129 Hypertensive chronic kidney disease with stage 1 through stage 4 chronic kidney disease, or unspecified chronic kidney disease: Secondary | ICD-10-CM | POA: Diagnosis not present

## 2023-07-10 DIAGNOSIS — M1990 Unspecified osteoarthritis, unspecified site: Secondary | ICD-10-CM | POA: Diagnosis not present

## 2023-07-10 DIAGNOSIS — K219 Gastro-esophageal reflux disease without esophagitis: Secondary | ICD-10-CM | POA: Diagnosis not present

## 2023-07-10 DIAGNOSIS — Z79899 Other long term (current) drug therapy: Secondary | ICD-10-CM | POA: Diagnosis not present

## 2023-07-10 DIAGNOSIS — Z130 Encounter for screening for diseases of the blood and blood-forming organs and certain disorders involving the immune mechanism: Secondary | ICD-10-CM | POA: Diagnosis not present

## 2023-07-10 DIAGNOSIS — E039 Hypothyroidism, unspecified: Secondary | ICD-10-CM | POA: Diagnosis not present

## 2023-07-10 DIAGNOSIS — Z9181 History of falling: Secondary | ICD-10-CM | POA: Diagnosis not present

## 2023-07-10 DIAGNOSIS — Z7982 Long term (current) use of aspirin: Secondary | ICD-10-CM | POA: Diagnosis not present

## 2023-07-10 DIAGNOSIS — Z87440 Personal history of urinary (tract) infections: Secondary | ICD-10-CM | POA: Diagnosis not present

## 2023-07-10 DIAGNOSIS — Z556 Problems related to health literacy: Secondary | ICD-10-CM | POA: Diagnosis not present

## 2023-07-10 DIAGNOSIS — Z1329 Encounter for screening for other suspected endocrine disorder: Secondary | ICD-10-CM | POA: Diagnosis not present

## 2023-07-10 DIAGNOSIS — F32A Depression, unspecified: Secondary | ICD-10-CM | POA: Diagnosis not present

## 2023-07-11 DIAGNOSIS — N1831 Chronic kidney disease, stage 3a: Secondary | ICD-10-CM | POA: Diagnosis not present

## 2023-07-11 DIAGNOSIS — E039 Hypothyroidism, unspecified: Secondary | ICD-10-CM | POA: Diagnosis not present

## 2023-07-11 DIAGNOSIS — D649 Anemia, unspecified: Secondary | ICD-10-CM | POA: Diagnosis not present

## 2023-07-11 DIAGNOSIS — F0390 Unspecified dementia without behavioral disturbance: Secondary | ICD-10-CM | POA: Diagnosis not present

## 2023-07-15 DIAGNOSIS — F32A Depression, unspecified: Secondary | ICD-10-CM | POA: Diagnosis not present

## 2023-07-15 DIAGNOSIS — M810 Age-related osteoporosis without current pathological fracture: Secondary | ICD-10-CM | POA: Diagnosis not present

## 2023-07-15 DIAGNOSIS — F0283 Dementia in other diseases classified elsewhere, unspecified severity, with mood disturbance: Secondary | ICD-10-CM | POA: Diagnosis not present

## 2023-07-15 DIAGNOSIS — F0284 Dementia in other diseases classified elsewhere, unspecified severity, with anxiety: Secondary | ICD-10-CM | POA: Diagnosis not present

## 2023-07-15 DIAGNOSIS — M1990 Unspecified osteoarthritis, unspecified site: Secondary | ICD-10-CM | POA: Diagnosis not present

## 2023-07-15 DIAGNOSIS — E039 Hypothyroidism, unspecified: Secondary | ICD-10-CM | POA: Diagnosis not present

## 2023-07-17 DIAGNOSIS — M1990 Unspecified osteoarthritis, unspecified site: Secondary | ICD-10-CM | POA: Diagnosis not present

## 2023-07-17 DIAGNOSIS — F0284 Dementia in other diseases classified elsewhere, unspecified severity, with anxiety: Secondary | ICD-10-CM | POA: Diagnosis not present

## 2023-07-17 DIAGNOSIS — F32A Depression, unspecified: Secondary | ICD-10-CM | POA: Diagnosis not present

## 2023-07-17 DIAGNOSIS — E039 Hypothyroidism, unspecified: Secondary | ICD-10-CM | POA: Diagnosis not present

## 2023-07-17 DIAGNOSIS — F0283 Dementia in other diseases classified elsewhere, unspecified severity, with mood disturbance: Secondary | ICD-10-CM | POA: Diagnosis not present

## 2023-07-17 DIAGNOSIS — M810 Age-related osteoporosis without current pathological fracture: Secondary | ICD-10-CM | POA: Diagnosis not present

## 2023-07-21 DIAGNOSIS — R059 Cough, unspecified: Secondary | ICD-10-CM | POA: Diagnosis not present

## 2023-07-21 DIAGNOSIS — J309 Allergic rhinitis, unspecified: Secondary | ICD-10-CM | POA: Diagnosis not present

## 2023-07-22 DIAGNOSIS — M810 Age-related osteoporosis without current pathological fracture: Secondary | ICD-10-CM | POA: Diagnosis not present

## 2023-07-22 DIAGNOSIS — F0284 Dementia in other diseases classified elsewhere, unspecified severity, with anxiety: Secondary | ICD-10-CM | POA: Diagnosis not present

## 2023-07-22 DIAGNOSIS — F0283 Dementia in other diseases classified elsewhere, unspecified severity, with mood disturbance: Secondary | ICD-10-CM | POA: Diagnosis not present

## 2023-07-22 DIAGNOSIS — F32A Depression, unspecified: Secondary | ICD-10-CM | POA: Diagnosis not present

## 2023-07-22 DIAGNOSIS — E039 Hypothyroidism, unspecified: Secondary | ICD-10-CM | POA: Diagnosis not present

## 2023-07-22 DIAGNOSIS — M1990 Unspecified osteoarthritis, unspecified site: Secondary | ICD-10-CM | POA: Diagnosis not present

## 2023-07-24 DIAGNOSIS — M810 Age-related osteoporosis without current pathological fracture: Secondary | ICD-10-CM | POA: Diagnosis not present

## 2023-07-24 DIAGNOSIS — F0283 Dementia in other diseases classified elsewhere, unspecified severity, with mood disturbance: Secondary | ICD-10-CM | POA: Diagnosis not present

## 2023-07-24 DIAGNOSIS — E039 Hypothyroidism, unspecified: Secondary | ICD-10-CM | POA: Diagnosis not present

## 2023-07-24 DIAGNOSIS — F0284 Dementia in other diseases classified elsewhere, unspecified severity, with anxiety: Secondary | ICD-10-CM | POA: Diagnosis not present

## 2023-07-24 DIAGNOSIS — F32A Depression, unspecified: Secondary | ICD-10-CM | POA: Diagnosis not present

## 2023-07-24 DIAGNOSIS — M1990 Unspecified osteoarthritis, unspecified site: Secondary | ICD-10-CM | POA: Diagnosis not present

## 2023-07-28 DIAGNOSIS — M1990 Unspecified osteoarthritis, unspecified site: Secondary | ICD-10-CM | POA: Diagnosis not present

## 2023-07-28 DIAGNOSIS — F32A Depression, unspecified: Secondary | ICD-10-CM | POA: Diagnosis not present

## 2023-07-28 DIAGNOSIS — M810 Age-related osteoporosis without current pathological fracture: Secondary | ICD-10-CM | POA: Diagnosis not present

## 2023-07-28 DIAGNOSIS — F0283 Dementia in other diseases classified elsewhere, unspecified severity, with mood disturbance: Secondary | ICD-10-CM | POA: Diagnosis not present

## 2023-07-28 DIAGNOSIS — F0284 Dementia in other diseases classified elsewhere, unspecified severity, with anxiety: Secondary | ICD-10-CM | POA: Diagnosis not present

## 2023-07-28 DIAGNOSIS — E039 Hypothyroidism, unspecified: Secondary | ICD-10-CM | POA: Diagnosis not present

## 2023-07-31 DIAGNOSIS — E039 Hypothyroidism, unspecified: Secondary | ICD-10-CM | POA: Diagnosis not present

## 2023-07-31 DIAGNOSIS — F0283 Dementia in other diseases classified elsewhere, unspecified severity, with mood disturbance: Secondary | ICD-10-CM | POA: Diagnosis not present

## 2023-07-31 DIAGNOSIS — F0284 Dementia in other diseases classified elsewhere, unspecified severity, with anxiety: Secondary | ICD-10-CM | POA: Diagnosis not present

## 2023-07-31 DIAGNOSIS — M1990 Unspecified osteoarthritis, unspecified site: Secondary | ICD-10-CM | POA: Diagnosis not present

## 2023-07-31 DIAGNOSIS — F32A Depression, unspecified: Secondary | ICD-10-CM | POA: Diagnosis not present

## 2023-07-31 DIAGNOSIS — M810 Age-related osteoporosis without current pathological fracture: Secondary | ICD-10-CM | POA: Diagnosis not present

## 2023-08-04 DIAGNOSIS — F0284 Dementia in other diseases classified elsewhere, unspecified severity, with anxiety: Secondary | ICD-10-CM | POA: Diagnosis not present

## 2023-08-04 DIAGNOSIS — F32A Depression, unspecified: Secondary | ICD-10-CM | POA: Diagnosis not present

## 2023-08-04 DIAGNOSIS — M810 Age-related osteoporosis without current pathological fracture: Secondary | ICD-10-CM | POA: Diagnosis not present

## 2023-08-04 DIAGNOSIS — E039 Hypothyroidism, unspecified: Secondary | ICD-10-CM | POA: Diagnosis not present

## 2023-08-04 DIAGNOSIS — M1990 Unspecified osteoarthritis, unspecified site: Secondary | ICD-10-CM | POA: Diagnosis not present

## 2023-08-04 DIAGNOSIS — F0283 Dementia in other diseases classified elsewhere, unspecified severity, with mood disturbance: Secondary | ICD-10-CM | POA: Diagnosis not present

## 2023-08-06 DIAGNOSIS — F0283 Dementia in other diseases classified elsewhere, unspecified severity, with mood disturbance: Secondary | ICD-10-CM | POA: Diagnosis not present

## 2023-08-06 DIAGNOSIS — F32A Depression, unspecified: Secondary | ICD-10-CM | POA: Diagnosis not present

## 2023-08-06 DIAGNOSIS — M1990 Unspecified osteoarthritis, unspecified site: Secondary | ICD-10-CM | POA: Diagnosis not present

## 2023-08-06 DIAGNOSIS — E039 Hypothyroidism, unspecified: Secondary | ICD-10-CM | POA: Diagnosis not present

## 2023-08-06 DIAGNOSIS — F0284 Dementia in other diseases classified elsewhere, unspecified severity, with anxiety: Secondary | ICD-10-CM | POA: Diagnosis not present

## 2023-08-06 DIAGNOSIS — M810 Age-related osteoporosis without current pathological fracture: Secondary | ICD-10-CM | POA: Diagnosis not present

## 2023-08-09 DIAGNOSIS — Z7982 Long term (current) use of aspirin: Secondary | ICD-10-CM | POA: Diagnosis not present

## 2023-08-09 DIAGNOSIS — M1990 Unspecified osteoarthritis, unspecified site: Secondary | ICD-10-CM | POA: Diagnosis not present

## 2023-08-09 DIAGNOSIS — E039 Hypothyroidism, unspecified: Secondary | ICD-10-CM | POA: Diagnosis not present

## 2023-08-09 DIAGNOSIS — K219 Gastro-esophageal reflux disease without esophagitis: Secondary | ICD-10-CM | POA: Diagnosis not present

## 2023-08-09 DIAGNOSIS — Z9181 History of falling: Secondary | ICD-10-CM | POA: Diagnosis not present

## 2023-08-09 DIAGNOSIS — F32A Depression, unspecified: Secondary | ICD-10-CM | POA: Diagnosis not present

## 2023-08-09 DIAGNOSIS — E785 Hyperlipidemia, unspecified: Secondary | ICD-10-CM | POA: Diagnosis not present

## 2023-08-09 DIAGNOSIS — N189 Chronic kidney disease, unspecified: Secondary | ICD-10-CM | POA: Diagnosis not present

## 2023-08-09 DIAGNOSIS — Z556 Problems related to health literacy: Secondary | ICD-10-CM | POA: Diagnosis not present

## 2023-08-09 DIAGNOSIS — M810 Age-related osteoporosis without current pathological fracture: Secondary | ICD-10-CM | POA: Diagnosis not present

## 2023-08-09 DIAGNOSIS — F0284 Dementia in other diseases classified elsewhere, unspecified severity, with anxiety: Secondary | ICD-10-CM | POA: Diagnosis not present

## 2023-08-09 DIAGNOSIS — J302 Other seasonal allergic rhinitis: Secondary | ICD-10-CM | POA: Diagnosis not present

## 2023-08-09 DIAGNOSIS — Z87440 Personal history of urinary (tract) infections: Secondary | ICD-10-CM | POA: Diagnosis not present

## 2023-08-09 DIAGNOSIS — F0283 Dementia in other diseases classified elsewhere, unspecified severity, with mood disturbance: Secondary | ICD-10-CM | POA: Diagnosis not present

## 2023-08-09 DIAGNOSIS — I129 Hypertensive chronic kidney disease with stage 1 through stage 4 chronic kidney disease, or unspecified chronic kidney disease: Secondary | ICD-10-CM | POA: Diagnosis not present

## 2023-08-09 DIAGNOSIS — Z79899 Other long term (current) drug therapy: Secondary | ICD-10-CM | POA: Diagnosis not present

## 2023-08-10 ENCOUNTER — Other Ambulatory Visit: Payer: Self-pay | Admitting: Family Medicine

## 2023-08-11 DIAGNOSIS — F32A Depression, unspecified: Secondary | ICD-10-CM | POA: Diagnosis not present

## 2023-08-11 DIAGNOSIS — M810 Age-related osteoporosis without current pathological fracture: Secondary | ICD-10-CM | POA: Diagnosis not present

## 2023-08-11 DIAGNOSIS — E039 Hypothyroidism, unspecified: Secondary | ICD-10-CM | POA: Diagnosis not present

## 2023-08-11 DIAGNOSIS — M1990 Unspecified osteoarthritis, unspecified site: Secondary | ICD-10-CM | POA: Diagnosis not present

## 2023-08-11 DIAGNOSIS — F0284 Dementia in other diseases classified elsewhere, unspecified severity, with anxiety: Secondary | ICD-10-CM | POA: Diagnosis not present

## 2023-08-11 DIAGNOSIS — F0283 Dementia in other diseases classified elsewhere, unspecified severity, with mood disturbance: Secondary | ICD-10-CM | POA: Diagnosis not present

## 2023-08-19 DIAGNOSIS — E039 Hypothyroidism, unspecified: Secondary | ICD-10-CM | POA: Diagnosis not present

## 2023-08-19 DIAGNOSIS — F0283 Dementia in other diseases classified elsewhere, unspecified severity, with mood disturbance: Secondary | ICD-10-CM | POA: Diagnosis not present

## 2023-08-19 DIAGNOSIS — M810 Age-related osteoporosis without current pathological fracture: Secondary | ICD-10-CM | POA: Diagnosis not present

## 2023-08-19 DIAGNOSIS — F32A Depression, unspecified: Secondary | ICD-10-CM | POA: Diagnosis not present

## 2023-08-19 DIAGNOSIS — M1990 Unspecified osteoarthritis, unspecified site: Secondary | ICD-10-CM | POA: Diagnosis not present

## 2023-08-19 DIAGNOSIS — F0284 Dementia in other diseases classified elsewhere, unspecified severity, with anxiety: Secondary | ICD-10-CM | POA: Diagnosis not present

## 2023-08-27 ENCOUNTER — Telehealth: Payer: Self-pay | Admitting: Internal Medicine

## 2023-08-27 ENCOUNTER — Telehealth: Payer: Self-pay

## 2023-08-27 ENCOUNTER — Ambulatory Visit (INDEPENDENT_AMBULATORY_CARE_PROVIDER_SITE_OTHER): Payer: Medicare Other | Admitting: Podiatry

## 2023-08-27 DIAGNOSIS — I739 Peripheral vascular disease, unspecified: Secondary | ICD-10-CM | POA: Diagnosis not present

## 2023-08-27 DIAGNOSIS — B351 Tinea unguium: Secondary | ICD-10-CM

## 2023-08-27 DIAGNOSIS — M79674 Pain in right toe(s): Secondary | ICD-10-CM

## 2023-08-27 DIAGNOSIS — M79675 Pain in left toe(s): Secondary | ICD-10-CM

## 2023-08-27 DIAGNOSIS — L84 Corns and callosities: Secondary | ICD-10-CM | POA: Diagnosis not present

## 2023-08-27 NOTE — Progress Notes (Signed)
       Subjective:  Patient ID: Karen Briggs, female    DOB: 16-Apr-1935,  MRN: [id number]  Karen Briggs presents to clinic today for:  Chief Complaint  Patient presents with   [facility]    RFC with callous and a corn on the left 5th toe.  Not diabetic and takes ASA 81   Patient notes nails are thick and elongated, causing pain in shoe gear when ambulating.  [name]  PCP is Tita Form, MD. last seen on 06/03/2023  Past Medical History:  Diagnosis Date   BMI 26.0-26.9,adult 09/16/2019   Chronic kidney disease, stage 3a (HCC) 05/31/2019   Essential hypertension 10/17/2017   Iron  deficiency anemia 05/31/2019   Kidney disease    Stage 3   Mixed hyperlipidemia 10/17/2017   Osteoporosis 12/07/2019   Other specified hypothyroidism 10/17/2017   Palpitations 10/17/2017   Plantar wart 09/15/2019   Vitamin D  insufficiency 05/31/2019    Allergies  Allergen Reactions   Levofloxacin Palpitations   Alendronate      diarrhea   Sulfa Antibiotics Nausea Only    Objective:  Karen Briggs is a pleasant 89 y.o. female in NAD. AAO x 3.  Vascular Examination: Patient has palpable DP pulse, absent PT pulse bilateral.  Delayed capillary refill bilateral toes.  Sparse digital hair bilateral.  Proximal to distal cooling WNL bilateral.    Dermatological Examination: Interspaces are clear with no open lesions noted bilateral.  Skin is shiny and atrophic bilateral.  Nails are 3-20mm thick, with yellowish/brown discoloration, subungual debris and distal onycholysis x10.  There is pain with compression of nails x10.  There are hyperkeratotic lesions noted on the dorsal lateral aspect of the left fifth toe at the PIP joint.  Patient qualifies for at-risk foot care because of PVD.  Assessment/Plan: 1. Pain due to onychomycosis of toenails of both feet   2. Corn of toe   3. PVD (peripheral vascular disease) (HCC)     Mycotic nails x10 were sharply debrided with sterile nail  nippers and power debriding burr to decrease bulk and length.  Hyperkeratotic lesion on dorsal lateral aspect of the left fifth toe at the proximal interphalangeal joint was shaved with #312 blade.  Return in about 3 months (around 11/27/2023) for RFC.   Joe Murders, DPM, FACFAS [facility]     [address] [place], [place] [address]                Office [phone]  Fax [phone]

## 2023-08-27 NOTE — Telephone Encounter (Signed)
 Called patient's daughter to try to schedule an AWV that is due in November. Left a voicemail for them to call the office if and when they would like to schedule an appointment.

## 2023-08-27 NOTE — Telephone Encounter (Signed)
 Please call this patient to schedule the next AWV appointment with Delford Felling, FNP Last date of AWV: 02/20/2023

## 2023-08-27 NOTE — Telephone Encounter (Signed)
 Called and left a voicemail for patient's daughter to call the office if and when they would like to schedule an upcoming AWV.

## 2023-08-28 DIAGNOSIS — E039 Hypothyroidism, unspecified: Secondary | ICD-10-CM | POA: Diagnosis not present

## 2023-08-28 DIAGNOSIS — F0284 Dementia in other diseases classified elsewhere, unspecified severity, with anxiety: Secondary | ICD-10-CM | POA: Diagnosis not present

## 2023-08-28 DIAGNOSIS — M810 Age-related osteoporosis without current pathological fracture: Secondary | ICD-10-CM | POA: Diagnosis not present

## 2023-08-28 DIAGNOSIS — F32A Depression, unspecified: Secondary | ICD-10-CM | POA: Diagnosis not present

## 2023-08-28 DIAGNOSIS — F0283 Dementia in other diseases classified elsewhere, unspecified severity, with mood disturbance: Secondary | ICD-10-CM | POA: Diagnosis not present

## 2023-08-28 DIAGNOSIS — M1990 Unspecified osteoarthritis, unspecified site: Secondary | ICD-10-CM | POA: Diagnosis not present

## 2023-09-04 DIAGNOSIS — M1990 Unspecified osteoarthritis, unspecified site: Secondary | ICD-10-CM | POA: Diagnosis not present

## 2023-09-04 DIAGNOSIS — F0284 Dementia in other diseases classified elsewhere, unspecified severity, with anxiety: Secondary | ICD-10-CM | POA: Diagnosis not present

## 2023-09-04 DIAGNOSIS — E039 Hypothyroidism, unspecified: Secondary | ICD-10-CM | POA: Diagnosis not present

## 2023-09-04 DIAGNOSIS — F32A Depression, unspecified: Secondary | ICD-10-CM | POA: Diagnosis not present

## 2023-09-04 DIAGNOSIS — F0283 Dementia in other diseases classified elsewhere, unspecified severity, with mood disturbance: Secondary | ICD-10-CM | POA: Diagnosis not present

## 2023-09-04 DIAGNOSIS — M810 Age-related osteoporosis without current pathological fracture: Secondary | ICD-10-CM | POA: Diagnosis not present

## 2023-09-08 ENCOUNTER — Ambulatory Visit: Payer: Medicare Other | Admitting: Family Medicine

## 2023-09-23 DIAGNOSIS — N390 Urinary tract infection, site not specified: Secondary | ICD-10-CM | POA: Diagnosis not present

## 2023-10-29 DIAGNOSIS — R600 Localized edema: Secondary | ICD-10-CM | POA: Diagnosis not present

## 2023-10-29 DIAGNOSIS — R634 Abnormal weight loss: Secondary | ICD-10-CM | POA: Diagnosis not present

## 2023-10-29 DIAGNOSIS — F0390 Unspecified dementia without behavioral disturbance: Secondary | ICD-10-CM | POA: Diagnosis not present

## 2023-10-30 ENCOUNTER — Ambulatory Visit (INDEPENDENT_AMBULATORY_CARE_PROVIDER_SITE_OTHER): Admitting: Podiatry

## 2023-10-30 DIAGNOSIS — M79674 Pain in right toe(s): Secondary | ICD-10-CM

## 2023-10-30 DIAGNOSIS — I739 Peripheral vascular disease, unspecified: Secondary | ICD-10-CM

## 2023-10-30 DIAGNOSIS — M79675 Pain in left toe(s): Secondary | ICD-10-CM | POA: Diagnosis not present

## 2023-10-30 DIAGNOSIS — B351 Tinea unguium: Secondary | ICD-10-CM | POA: Diagnosis not present

## 2023-10-30 DIAGNOSIS — L84 Corns and callosities: Secondary | ICD-10-CM

## 2023-10-30 NOTE — Progress Notes (Signed)
       Subjective:  Patient ID: Karen Briggs, female    DOB: [birth year],  MRN: [id number]  Karen Briggs presents to clinic today for:  Chief Complaint  Patient presents with   [facility]    Not diabetic, no anticoag. Nails trimmed. Has corn on left 5th that needs shaving down at times, but patient reports no issues at this time.    Patient notes nails are thick and elongated, causing pain in shoe gear when ambulating.  She also has painful calluses bilateral great toe joint.  PCP is Caleen Dirks, MD. last televisit was 08/27/2023.  Past Medical History:  Diagnosis Date   BMI 26.0-26.9,adult 09/16/2019   Chronic kidney disease, stage 3a (HCC) 05/31/2019   Essential hypertension 10/17/2017   Iron  deficiency anemia 05/31/2019   Kidney disease    Stage 3   Mixed hyperlipidemia 10/17/2017   Osteoporosis 12/07/2019   Other specified hypothyroidism 10/17/2017   Palpitations 10/17/2017   Plantar wart 09/15/2019   Vitamin D  insufficiency 05/31/2019    Allergies  Allergen Reactions   Levofloxacin Palpitations   Alendronate      diarrhea   Sulfa Antibiotics Nausea Only    Objective:  Karen Briggs is a pleasant 89 y.o. female in NAD. AAO x 3.  Vascular Examination: Patient has palpable DP pulse, absent PT pulse bilateral.  Delayed capillary refill bilateral toes.  Sparse digital hair bilateral.  Proximal to distal cooling WNL bilateral.    Dermatological Examination: Interspaces are clear with no open lesions noted bilateral.  Skin is shiny and atrophic bilateral.  Nails are 3-26mm thick, with yellowish/brown discoloration, subungual debris and distal onycholysis x10.  There is pain with compression of nails x10.  There are hyperkeratotic lesions noted on the plantar medial aspect of the first MPJ bilateral  Patient qualifies for at-risk foot care because of PVD.  Assessment/Plan: 1. Pain due to onychomycosis of toenails of both feet   2. Callus of foot   3. PVD (peripheral vascular disease) (HCC)      Mycotic nails x10 were sharply debrided with sterile nail nippers and power debriding burr to decrease bulk and length.  Hyperkeratotic lesion x 2 was shaved with #312 blade.  Return in about 10 weeks (around 01/08/2024) for RFC.   Karen Briggs, DPM, FACFAS [facility]     [address] [place], [place] [address]                Office [phone]  Fax [phone]

## 2023-11-10 ENCOUNTER — Telehealth: Payer: Self-pay

## 2023-11-10 DIAGNOSIS — M2011 Hallux valgus (acquired), right foot: Secondary | ICD-10-CM | POA: Diagnosis not present

## 2023-11-10 DIAGNOSIS — I7091 Generalized atherosclerosis: Secondary | ICD-10-CM | POA: Diagnosis not present

## 2023-11-10 DIAGNOSIS — B351 Tinea unguium: Secondary | ICD-10-CM | POA: Diagnosis not present

## 2023-11-10 DIAGNOSIS — M2012 Hallux valgus (acquired), left foot: Secondary | ICD-10-CM | POA: Diagnosis not present

## 2023-11-10 NOTE — Telephone Encounter (Signed)
 Left message for patient's daughter that Karen Briggs will be due for her next Prolia  injection 12/05/23 -- patient resides at [facility] now and her care is managed by the facility.  I recommended that she make them aware that she will soon be due if they want to continue that treatment.

## 2023-11-12 DIAGNOSIS — F0390 Unspecified dementia without behavioral disturbance: Secondary | ICD-10-CM | POA: Diagnosis not present

## 2023-11-12 DIAGNOSIS — N1831 Chronic kidney disease, stage 3a: Secondary | ICD-10-CM | POA: Diagnosis not present

## 2023-11-12 DIAGNOSIS — M1990 Unspecified osteoarthritis, unspecified site: Secondary | ICD-10-CM | POA: Diagnosis not present

## 2023-11-12 DIAGNOSIS — J309 Allergic rhinitis, unspecified: Secondary | ICD-10-CM | POA: Diagnosis not present

## 2023-11-12 DIAGNOSIS — E039 Hypothyroidism, unspecified: Secondary | ICD-10-CM | POA: Diagnosis not present

## 2023-11-12 DIAGNOSIS — M6281 Muscle weakness (generalized): Secondary | ICD-10-CM | POA: Diagnosis not present

## 2023-11-12 DIAGNOSIS — I129 Hypertensive chronic kidney disease with stage 1 through stage 4 chronic kidney disease, or unspecified chronic kidney disease: Secondary | ICD-10-CM | POA: Diagnosis not present

## 2023-11-12 DIAGNOSIS — M810 Age-related osteoporosis without current pathological fracture: Secondary | ICD-10-CM | POA: Diagnosis not present

## 2023-11-12 DIAGNOSIS — G894 Chronic pain syndrome: Secondary | ICD-10-CM | POA: Diagnosis not present

## 2023-11-12 DIAGNOSIS — K219 Gastro-esophageal reflux disease without esophagitis: Secondary | ICD-10-CM | POA: Diagnosis not present

## 2023-11-12 DIAGNOSIS — D649 Anemia, unspecified: Secondary | ICD-10-CM | POA: Diagnosis not present

## 2023-11-14 DIAGNOSIS — Z130 Encounter for screening for diseases of the blood and blood-forming organs and certain disorders involving the immune mechanism: Secondary | ICD-10-CM | POA: Diagnosis not present

## 2023-11-14 DIAGNOSIS — Z79899 Other long term (current) drug therapy: Secondary | ICD-10-CM | POA: Diagnosis not present

## 2023-11-14 DIAGNOSIS — Z1329 Encounter for screening for other suspected endocrine disorder: Secondary | ICD-10-CM | POA: Diagnosis not present

## 2023-11-27 DIAGNOSIS — F0390 Unspecified dementia without behavioral disturbance: Secondary | ICD-10-CM | POA: Diagnosis not present

## 2023-11-27 DIAGNOSIS — D649 Anemia, unspecified: Secondary | ICD-10-CM | POA: Diagnosis not present

## 2023-11-27 DIAGNOSIS — E871 Hypo-osmolality and hyponatremia: Secondary | ICD-10-CM | POA: Diagnosis not present

## 2023-11-27 DIAGNOSIS — E039 Hypothyroidism, unspecified: Secondary | ICD-10-CM | POA: Diagnosis not present

## 2023-11-27 DIAGNOSIS — N1831 Chronic kidney disease, stage 3a: Secondary | ICD-10-CM | POA: Diagnosis not present

## 2023-12-03 DIAGNOSIS — F0390 Unspecified dementia without behavioral disturbance: Secondary | ICD-10-CM | POA: Diagnosis not present

## 2023-12-03 DIAGNOSIS — R634 Abnormal weight loss: Secondary | ICD-10-CM | POA: Diagnosis not present

## 2023-12-03 DIAGNOSIS — R600 Localized edema: Secondary | ICD-10-CM | POA: Diagnosis not present

## 2023-12-09 DIAGNOSIS — H9193 Unspecified hearing loss, bilateral: Secondary | ICD-10-CM | POA: Diagnosis not present

## 2023-12-09 DIAGNOSIS — H6123 Impacted cerumen, bilateral: Secondary | ICD-10-CM | POA: Diagnosis not present

## 2024-01-09 DIAGNOSIS — Z23 Encounter for immunization: Secondary | ICD-10-CM | POA: Diagnosis not present

## 2024-01-15 ENCOUNTER — Ambulatory Visit (INDEPENDENT_AMBULATORY_CARE_PROVIDER_SITE_OTHER): Admitting: Podiatry

## 2024-01-15 DIAGNOSIS — M79674 Pain in right toe(s): Secondary | ICD-10-CM | POA: Diagnosis not present

## 2024-01-15 DIAGNOSIS — B351 Tinea unguium: Secondary | ICD-10-CM | POA: Diagnosis not present

## 2024-01-15 DIAGNOSIS — I739 Peripheral vascular disease, unspecified: Secondary | ICD-10-CM

## 2024-01-15 DIAGNOSIS — M79675 Pain in left toe(s): Secondary | ICD-10-CM

## 2024-01-15 NOTE — Progress Notes (Signed)
       Subjective:  Patient ID: Karen Briggs, female    DOB: 1934-11-18,  MRN: [id number]  Karen Briggs presents to clinic today for:  Chief Complaint  Patient presents with   [facility]    RFC no callous, daughter is unsure why her nails are so short, she is going to be checking at the facility to see if they have been cutting them, stated there was no charge on her insurance for it from the facility. Not diabetic, ASA   Patient notes nails are thick and elongated, causing pain in shoe gear when ambulating.  Patient's nails are quite short today and they are not sure if she was seen by the house podiatrist at the facility where she resides.  A Medicare ABN was reviewed and signed by the patient and witnessed by her daughter today in case today's visit is too frequent for nail care.  They did request that we continue with nail care today.  PCP is Caleen Dirks, MD.   Past Medical History:  Diagnosis Date   BMI 26.0-26.9,adult 09/16/2019   Chronic kidney disease, stage 3a (HCC) 05/31/2019   Essential hypertension 10/17/2017   Iron  deficiency anemia 05/31/2019   Kidney disease    Stage 3   Mixed hyperlipidemia 10/17/2017   Osteoporosis 12/07/2019   Other specified hypothyroidism 10/17/2017   Palpitations 10/17/2017   Plantar wart 09/15/2019   Vitamin D  insufficiency 05/31/2019    Allergies  Allergen Reactions   Levofloxacin Palpitations   Alendronate      diarrhea   Sulfa Antibiotics Nausea Only    Objective:  Karen Briggs is a pleasant 89 y.o. female in NAD. AAO x 3.  Vascular Examination: Patient has palpable DP pulse, absent PT pulse bilateral.  Delayed capillary refill bilateral toes.  Sparse digital hair bilateral.  Proximal to distal cooling WNL bilateral.    Dermatological Examination: Interspaces are clear with no open lesions noted bilateral.  Skin is shiny and atrophic bilateral.  Nails are 3-27mm thick, with yellowish/brown discoloration, subungual debris and distal onycholysis x10.   There is pain with compression of nails x10.    Patient qualifies for at-risk foot care because of PVD.  Assessment/Plan: 1. Pain due to onychomycosis of toenails of both feet   2. PVD (peripheral vascular disease)     Mycotic nails x10 were sharply debrided with sterile nail nippers and power debriding burr to decrease bulk and length.  Return in about 14 weeks (around 04/22/2024) for RFC.   Karen Briggs, DPM, FACFAS [facility]     [address] [place], [place] [address]                Office [phone]  Fax [phone]

## 2024-01-21 DIAGNOSIS — I10 Essential (primary) hypertension: Secondary | ICD-10-CM | POA: Diagnosis not present

## 2024-01-21 DIAGNOSIS — M1990 Unspecified osteoarthritis, unspecified site: Secondary | ICD-10-CM | POA: Diagnosis not present

## 2024-01-21 DIAGNOSIS — M810 Age-related osteoporosis without current pathological fracture: Secondary | ICD-10-CM | POA: Diagnosis not present

## 2024-01-21 DIAGNOSIS — M6281 Muscle weakness (generalized): Secondary | ICD-10-CM | POA: Diagnosis not present

## 2024-01-21 DIAGNOSIS — F0390 Unspecified dementia without behavioral disturbance: Secondary | ICD-10-CM | POA: Diagnosis not present

## 2024-01-21 DIAGNOSIS — R2689 Other abnormalities of gait and mobility: Secondary | ICD-10-CM | POA: Diagnosis not present

## 2024-01-22 DIAGNOSIS — M159 Polyosteoarthritis, unspecified: Secondary | ICD-10-CM | POA: Diagnosis not present

## 2024-01-22 DIAGNOSIS — I129 Hypertensive chronic kidney disease with stage 1 through stage 4 chronic kidney disease, or unspecified chronic kidney disease: Secondary | ICD-10-CM | POA: Diagnosis not present

## 2024-01-22 DIAGNOSIS — E039 Hypothyroidism, unspecified: Secondary | ICD-10-CM | POA: Diagnosis not present

## 2024-01-22 DIAGNOSIS — E559 Vitamin D deficiency, unspecified: Secondary | ICD-10-CM | POA: Diagnosis not present

## 2024-01-22 DIAGNOSIS — N183 Chronic kidney disease, stage 3 unspecified: Secondary | ICD-10-CM | POA: Diagnosis not present

## 2024-01-22 DIAGNOSIS — K219 Gastro-esophageal reflux disease without esophagitis: Secondary | ICD-10-CM | POA: Diagnosis not present

## 2024-01-22 DIAGNOSIS — F0283 Dementia in other diseases classified elsewhere, unspecified severity, with mood disturbance: Secondary | ICD-10-CM | POA: Diagnosis not present

## 2024-01-22 DIAGNOSIS — E785 Hyperlipidemia, unspecified: Secondary | ICD-10-CM | POA: Diagnosis not present

## 2024-01-22 DIAGNOSIS — M810 Age-related osteoporosis without current pathological fracture: Secondary | ICD-10-CM | POA: Diagnosis not present

## 2024-01-22 DIAGNOSIS — G8929 Other chronic pain: Secondary | ICD-10-CM | POA: Diagnosis not present

## 2024-01-22 DIAGNOSIS — F0284 Dementia in other diseases classified elsewhere, unspecified severity, with anxiety: Secondary | ICD-10-CM | POA: Diagnosis not present

## 2024-01-22 DIAGNOSIS — Z556 Problems related to health literacy: Secondary | ICD-10-CM | POA: Diagnosis not present

## 2024-01-22 DIAGNOSIS — F329 Major depressive disorder, single episode, unspecified: Secondary | ICD-10-CM | POA: Diagnosis not present

## 2024-01-22 DIAGNOSIS — Z7982 Long term (current) use of aspirin: Secondary | ICD-10-CM | POA: Diagnosis not present

## 2024-01-22 DIAGNOSIS — Z9181 History of falling: Secondary | ICD-10-CM | POA: Diagnosis not present

## 2024-01-26 DIAGNOSIS — E039 Hypothyroidism, unspecified: Secondary | ICD-10-CM | POA: Diagnosis not present

## 2024-01-26 DIAGNOSIS — F0283 Dementia in other diseases classified elsewhere, unspecified severity, with mood disturbance: Secondary | ICD-10-CM | POA: Diagnosis not present

## 2024-01-26 DIAGNOSIS — F329 Major depressive disorder, single episode, unspecified: Secondary | ICD-10-CM | POA: Diagnosis not present

## 2024-01-26 DIAGNOSIS — F0284 Dementia in other diseases classified elsewhere, unspecified severity, with anxiety: Secondary | ICD-10-CM | POA: Diagnosis not present

## 2024-01-26 DIAGNOSIS — G8929 Other chronic pain: Secondary | ICD-10-CM | POA: Diagnosis not present

## 2024-01-26 DIAGNOSIS — M159 Polyosteoarthritis, unspecified: Secondary | ICD-10-CM | POA: Diagnosis not present

## 2024-01-28 DIAGNOSIS — E039 Hypothyroidism, unspecified: Secondary | ICD-10-CM | POA: Diagnosis not present

## 2024-01-28 DIAGNOSIS — F0284 Dementia in other diseases classified elsewhere, unspecified severity, with anxiety: Secondary | ICD-10-CM | POA: Diagnosis not present

## 2024-01-28 DIAGNOSIS — G8929 Other chronic pain: Secondary | ICD-10-CM | POA: Diagnosis not present

## 2024-01-28 DIAGNOSIS — M159 Polyosteoarthritis, unspecified: Secondary | ICD-10-CM | POA: Diagnosis not present

## 2024-01-28 DIAGNOSIS — F329 Major depressive disorder, single episode, unspecified: Secondary | ICD-10-CM | POA: Diagnosis not present

## 2024-01-28 DIAGNOSIS — F0283 Dementia in other diseases classified elsewhere, unspecified severity, with mood disturbance: Secondary | ICD-10-CM | POA: Diagnosis not present

## 2024-02-03 DIAGNOSIS — Z7401 Bed confinement status: Secondary | ICD-10-CM | POA: Diagnosis not present

## 2024-02-03 DIAGNOSIS — F0284 Dementia in other diseases classified elsewhere, unspecified severity, with anxiety: Secondary | ICD-10-CM | POA: Diagnosis not present

## 2024-02-03 DIAGNOSIS — M159 Polyosteoarthritis, unspecified: Secondary | ICD-10-CM | POA: Diagnosis not present

## 2024-02-03 DIAGNOSIS — R001 Bradycardia, unspecified: Secondary | ICD-10-CM | POA: Diagnosis not present

## 2024-02-03 DIAGNOSIS — R531 Weakness: Secondary | ICD-10-CM | POA: Diagnosis not present

## 2024-02-03 DIAGNOSIS — S0003XA Contusion of scalp, initial encounter: Secondary | ICD-10-CM | POA: Diagnosis not present

## 2024-02-03 DIAGNOSIS — E039 Hypothyroidism, unspecified: Secondary | ICD-10-CM | POA: Diagnosis not present

## 2024-02-03 DIAGNOSIS — R9431 Abnormal electrocardiogram [ECG] [EKG]: Secondary | ICD-10-CM | POA: Diagnosis not present

## 2024-02-03 DIAGNOSIS — F0283 Dementia in other diseases classified elsewhere, unspecified severity, with mood disturbance: Secondary | ICD-10-CM | POA: Diagnosis not present

## 2024-02-03 DIAGNOSIS — G8929 Other chronic pain: Secondary | ICD-10-CM | POA: Diagnosis not present

## 2024-02-03 DIAGNOSIS — F329 Major depressive disorder, single episode, unspecified: Secondary | ICD-10-CM | POA: Diagnosis not present

## 2024-02-03 DIAGNOSIS — W19XXXA Unspecified fall, initial encounter: Secondary | ICD-10-CM | POA: Diagnosis not present

## 2024-02-03 DIAGNOSIS — F0390 Unspecified dementia without behavioral disturbance: Secondary | ICD-10-CM | POA: Diagnosis not present

## 2024-02-04 DIAGNOSIS — M6281 Muscle weakness (generalized): Secondary | ICD-10-CM | POA: Diagnosis not present

## 2024-02-04 DIAGNOSIS — F0390 Unspecified dementia without behavioral disturbance: Secondary | ICD-10-CM | POA: Diagnosis not present

## 2024-02-06 DIAGNOSIS — G8929 Other chronic pain: Secondary | ICD-10-CM | POA: Diagnosis not present

## 2024-02-06 DIAGNOSIS — F0284 Dementia in other diseases classified elsewhere, unspecified severity, with anxiety: Secondary | ICD-10-CM | POA: Diagnosis not present

## 2024-02-06 DIAGNOSIS — M159 Polyosteoarthritis, unspecified: Secondary | ICD-10-CM | POA: Diagnosis not present

## 2024-02-06 DIAGNOSIS — F0283 Dementia in other diseases classified elsewhere, unspecified severity, with mood disturbance: Secondary | ICD-10-CM | POA: Diagnosis not present

## 2024-02-06 DIAGNOSIS — E039 Hypothyroidism, unspecified: Secondary | ICD-10-CM | POA: Diagnosis not present

## 2024-02-06 DIAGNOSIS — F329 Major depressive disorder, single episode, unspecified: Secondary | ICD-10-CM | POA: Diagnosis not present

## 2024-02-10 DIAGNOSIS — G8929 Other chronic pain: Secondary | ICD-10-CM | POA: Diagnosis not present

## 2024-02-10 DIAGNOSIS — F0283 Dementia in other diseases classified elsewhere, unspecified severity, with mood disturbance: Secondary | ICD-10-CM | POA: Diagnosis not present

## 2024-02-10 DIAGNOSIS — M159 Polyosteoarthritis, unspecified: Secondary | ICD-10-CM | POA: Diagnosis not present

## 2024-02-10 DIAGNOSIS — F329 Major depressive disorder, single episode, unspecified: Secondary | ICD-10-CM | POA: Diagnosis not present

## 2024-02-10 DIAGNOSIS — E039 Hypothyroidism, unspecified: Secondary | ICD-10-CM | POA: Diagnosis not present

## 2024-02-10 DIAGNOSIS — F0284 Dementia in other diseases classified elsewhere, unspecified severity, with anxiety: Secondary | ICD-10-CM | POA: Diagnosis not present

## 2024-02-12 DIAGNOSIS — G8929 Other chronic pain: Secondary | ICD-10-CM | POA: Diagnosis not present

## 2024-02-12 DIAGNOSIS — F329 Major depressive disorder, single episode, unspecified: Secondary | ICD-10-CM | POA: Diagnosis not present

## 2024-02-12 DIAGNOSIS — F0284 Dementia in other diseases classified elsewhere, unspecified severity, with anxiety: Secondary | ICD-10-CM | POA: Diagnosis not present

## 2024-02-12 DIAGNOSIS — E039 Hypothyroidism, unspecified: Secondary | ICD-10-CM | POA: Diagnosis not present

## 2024-02-12 DIAGNOSIS — F0283 Dementia in other diseases classified elsewhere, unspecified severity, with mood disturbance: Secondary | ICD-10-CM | POA: Diagnosis not present

## 2024-02-12 DIAGNOSIS — M159 Polyosteoarthritis, unspecified: Secondary | ICD-10-CM | POA: Diagnosis not present

## 2024-02-17 DIAGNOSIS — F329 Major depressive disorder, single episode, unspecified: Secondary | ICD-10-CM | POA: Diagnosis not present

## 2024-02-17 DIAGNOSIS — F0284 Dementia in other diseases classified elsewhere, unspecified severity, with anxiety: Secondary | ICD-10-CM | POA: Diagnosis not present

## 2024-02-17 DIAGNOSIS — M159 Polyosteoarthritis, unspecified: Secondary | ICD-10-CM | POA: Diagnosis not present

## 2024-02-17 DIAGNOSIS — G8929 Other chronic pain: Secondary | ICD-10-CM | POA: Diagnosis not present

## 2024-02-17 DIAGNOSIS — F0283 Dementia in other diseases classified elsewhere, unspecified severity, with mood disturbance: Secondary | ICD-10-CM | POA: Diagnosis not present

## 2024-02-17 DIAGNOSIS — E039 Hypothyroidism, unspecified: Secondary | ICD-10-CM | POA: Diagnosis not present

## 2024-02-18 DIAGNOSIS — M810 Age-related osteoporosis without current pathological fracture: Secondary | ICD-10-CM | POA: Diagnosis not present

## 2024-02-18 DIAGNOSIS — M159 Polyosteoarthritis, unspecified: Secondary | ICD-10-CM | POA: Diagnosis not present

## 2024-02-18 DIAGNOSIS — F0284 Dementia in other diseases classified elsewhere, unspecified severity, with anxiety: Secondary | ICD-10-CM | POA: Diagnosis not present

## 2024-02-18 DIAGNOSIS — F329 Major depressive disorder, single episode, unspecified: Secondary | ICD-10-CM | POA: Diagnosis not present

## 2024-02-18 DIAGNOSIS — G8929 Other chronic pain: Secondary | ICD-10-CM | POA: Diagnosis not present

## 2024-02-18 DIAGNOSIS — F0283 Dementia in other diseases classified elsewhere, unspecified severity, with mood disturbance: Secondary | ICD-10-CM | POA: Diagnosis not present

## 2024-02-18 DIAGNOSIS — E039 Hypothyroidism, unspecified: Secondary | ICD-10-CM | POA: Diagnosis not present

## 2024-02-21 DIAGNOSIS — Z7982 Long term (current) use of aspirin: Secondary | ICD-10-CM | POA: Diagnosis not present

## 2024-02-21 DIAGNOSIS — M159 Polyosteoarthritis, unspecified: Secondary | ICD-10-CM | POA: Diagnosis not present

## 2024-02-21 DIAGNOSIS — I129 Hypertensive chronic kidney disease with stage 1 through stage 4 chronic kidney disease, or unspecified chronic kidney disease: Secondary | ICD-10-CM | POA: Diagnosis not present

## 2024-02-21 DIAGNOSIS — F329 Major depressive disorder, single episode, unspecified: Secondary | ICD-10-CM | POA: Diagnosis not present

## 2024-02-21 DIAGNOSIS — E039 Hypothyroidism, unspecified: Secondary | ICD-10-CM | POA: Diagnosis not present

## 2024-02-21 DIAGNOSIS — M810 Age-related osteoporosis without current pathological fracture: Secondary | ICD-10-CM | POA: Diagnosis not present

## 2024-02-21 DIAGNOSIS — N183 Chronic kidney disease, stage 3 unspecified: Secondary | ICD-10-CM | POA: Diagnosis not present

## 2024-02-21 DIAGNOSIS — E785 Hyperlipidemia, unspecified: Secondary | ICD-10-CM | POA: Diagnosis not present

## 2024-02-21 DIAGNOSIS — Z9181 History of falling: Secondary | ICD-10-CM | POA: Diagnosis not present

## 2024-02-21 DIAGNOSIS — E559 Vitamin D deficiency, unspecified: Secondary | ICD-10-CM | POA: Diagnosis not present

## 2024-02-21 DIAGNOSIS — F0283 Dementia in other diseases classified elsewhere, unspecified severity, with mood disturbance: Secondary | ICD-10-CM | POA: Diagnosis not present

## 2024-02-21 DIAGNOSIS — G8929 Other chronic pain: Secondary | ICD-10-CM | POA: Diagnosis not present

## 2024-02-21 DIAGNOSIS — Z556 Problems related to health literacy: Secondary | ICD-10-CM | POA: Diagnosis not present

## 2024-02-21 DIAGNOSIS — F0284 Dementia in other diseases classified elsewhere, unspecified severity, with anxiety: Secondary | ICD-10-CM | POA: Diagnosis not present

## 2024-02-21 DIAGNOSIS — K219 Gastro-esophageal reflux disease without esophagitis: Secondary | ICD-10-CM | POA: Diagnosis not present

## 2024-02-25 DIAGNOSIS — G8929 Other chronic pain: Secondary | ICD-10-CM | POA: Diagnosis not present

## 2024-02-25 DIAGNOSIS — F329 Major depressive disorder, single episode, unspecified: Secondary | ICD-10-CM | POA: Diagnosis not present

## 2024-02-25 DIAGNOSIS — F0284 Dementia in other diseases classified elsewhere, unspecified severity, with anxiety: Secondary | ICD-10-CM | POA: Diagnosis not present

## 2024-02-25 DIAGNOSIS — E039 Hypothyroidism, unspecified: Secondary | ICD-10-CM | POA: Diagnosis not present

## 2024-02-25 DIAGNOSIS — M159 Polyosteoarthritis, unspecified: Secondary | ICD-10-CM | POA: Diagnosis not present

## 2024-02-25 DIAGNOSIS — F0283 Dementia in other diseases classified elsewhere, unspecified severity, with mood disturbance: Secondary | ICD-10-CM | POA: Diagnosis not present

## 2024-02-26 DIAGNOSIS — Z23 Encounter for immunization: Secondary | ICD-10-CM | POA: Diagnosis not present

## 2024-02-26 DIAGNOSIS — F0390 Unspecified dementia without behavioral disturbance: Secondary | ICD-10-CM | POA: Diagnosis not present

## 2024-02-26 DIAGNOSIS — F330 Major depressive disorder, recurrent, mild: Secondary | ICD-10-CM | POA: Diagnosis not present

## 2024-03-02 DIAGNOSIS — E039 Hypothyroidism, unspecified: Secondary | ICD-10-CM | POA: Diagnosis not present

## 2024-03-02 DIAGNOSIS — G8929 Other chronic pain: Secondary | ICD-10-CM | POA: Diagnosis not present

## 2024-03-02 DIAGNOSIS — M159 Polyosteoarthritis, unspecified: Secondary | ICD-10-CM | POA: Diagnosis not present

## 2024-03-02 DIAGNOSIS — F0283 Dementia in other diseases classified elsewhere, unspecified severity, with mood disturbance: Secondary | ICD-10-CM | POA: Diagnosis not present

## 2024-03-02 DIAGNOSIS — F329 Major depressive disorder, single episode, unspecified: Secondary | ICD-10-CM | POA: Diagnosis not present

## 2024-03-02 DIAGNOSIS — F0284 Dementia in other diseases classified elsewhere, unspecified severity, with anxiety: Secondary | ICD-10-CM | POA: Diagnosis not present

## 2024-03-05 DIAGNOSIS — E039 Hypothyroidism, unspecified: Secondary | ICD-10-CM | POA: Diagnosis not present

## 2024-03-05 DIAGNOSIS — F419 Anxiety disorder, unspecified: Secondary | ICD-10-CM | POA: Diagnosis not present

## 2024-03-05 DIAGNOSIS — J309 Allergic rhinitis, unspecified: Secondary | ICD-10-CM | POA: Diagnosis not present

## 2024-03-05 DIAGNOSIS — R262 Difficulty in walking, not elsewhere classified: Secondary | ICD-10-CM | POA: Diagnosis not present

## 2024-03-05 DIAGNOSIS — F0390 Unspecified dementia without behavioral disturbance: Secondary | ICD-10-CM | POA: Diagnosis not present

## 2024-03-05 DIAGNOSIS — K219 Gastro-esophageal reflux disease without esophagitis: Secondary | ICD-10-CM | POA: Diagnosis not present

## 2024-03-05 DIAGNOSIS — I129 Hypertensive chronic kidney disease with stage 1 through stage 4 chronic kidney disease, or unspecified chronic kidney disease: Secondary | ICD-10-CM | POA: Diagnosis not present

## 2024-03-08 DIAGNOSIS — I10 Essential (primary) hypertension: Secondary | ICD-10-CM | POA: Diagnosis not present

## 2024-03-08 DIAGNOSIS — Z130 Encounter for screening for diseases of the blood and blood-forming organs and certain disorders involving the immune mechanism: Secondary | ICD-10-CM | POA: Diagnosis not present

## 2024-03-08 DIAGNOSIS — Z79899 Other long term (current) drug therapy: Secondary | ICD-10-CM | POA: Diagnosis not present

## 2024-03-08 DIAGNOSIS — Z1329 Encounter for screening for other suspected endocrine disorder: Secondary | ICD-10-CM | POA: Diagnosis not present

## 2024-03-09 DIAGNOSIS — F0284 Dementia in other diseases classified elsewhere, unspecified severity, with anxiety: Secondary | ICD-10-CM | POA: Diagnosis not present

## 2024-03-09 DIAGNOSIS — F0283 Dementia in other diseases classified elsewhere, unspecified severity, with mood disturbance: Secondary | ICD-10-CM | POA: Diagnosis not present

## 2024-03-09 DIAGNOSIS — G8929 Other chronic pain: Secondary | ICD-10-CM | POA: Diagnosis not present

## 2024-03-09 DIAGNOSIS — E039 Hypothyroidism, unspecified: Secondary | ICD-10-CM | POA: Diagnosis not present

## 2024-03-09 DIAGNOSIS — M159 Polyosteoarthritis, unspecified: Secondary | ICD-10-CM | POA: Diagnosis not present

## 2024-03-09 DIAGNOSIS — F329 Major depressive disorder, single episode, unspecified: Secondary | ICD-10-CM | POA: Diagnosis not present

## 2024-03-10 DIAGNOSIS — R799 Abnormal finding of blood chemistry, unspecified: Secondary | ICD-10-CM

## 2024-03-10 DIAGNOSIS — F0390 Unspecified dementia without behavioral disturbance: Secondary | ICD-10-CM

## 2024-03-10 DIAGNOSIS — N1831 Chronic kidney disease, stage 3a: Secondary | ICD-10-CM

## 2024-03-10 DIAGNOSIS — R4181 Age-related cognitive decline: Secondary | ICD-10-CM

## 2024-03-12 DIAGNOSIS — B351 Tinea unguium: Secondary | ICD-10-CM | POA: Diagnosis not present

## 2024-03-12 DIAGNOSIS — I7091 Generalized atherosclerosis: Secondary | ICD-10-CM | POA: Diagnosis not present

## 2024-03-16 DIAGNOSIS — M159 Polyosteoarthritis, unspecified: Secondary | ICD-10-CM | POA: Diagnosis not present

## 2024-03-16 DIAGNOSIS — F329 Major depressive disorder, single episode, unspecified: Secondary | ICD-10-CM | POA: Diagnosis not present

## 2024-03-16 DIAGNOSIS — G8929 Other chronic pain: Secondary | ICD-10-CM | POA: Diagnosis not present

## 2024-03-16 DIAGNOSIS — F0283 Dementia in other diseases classified elsewhere, unspecified severity, with mood disturbance: Secondary | ICD-10-CM | POA: Diagnosis not present

## 2024-03-16 DIAGNOSIS — F0284 Dementia in other diseases classified elsewhere, unspecified severity, with anxiety: Secondary | ICD-10-CM | POA: Diagnosis not present

## 2024-03-16 DIAGNOSIS — E039 Hypothyroidism, unspecified: Secondary | ICD-10-CM | POA: Diagnosis not present

## 2024-03-22 DIAGNOSIS — Z79899 Other long term (current) drug therapy: Secondary | ICD-10-CM | POA: Diagnosis not present

## 2024-03-22 DIAGNOSIS — M810 Age-related osteoporosis without current pathological fracture: Secondary | ICD-10-CM | POA: Diagnosis not present

## 2024-03-22 DIAGNOSIS — E559 Vitamin D deficiency, unspecified: Secondary | ICD-10-CM | POA: Diagnosis not present

## 2024-04-28 ENCOUNTER — Ambulatory Visit: Admitting: Podiatry

## 2024-04-28 DIAGNOSIS — B351 Tinea unguium: Secondary | ICD-10-CM

## 2024-04-28 DIAGNOSIS — I739 Peripheral vascular disease, unspecified: Secondary | ICD-10-CM

## 2024-04-28 DIAGNOSIS — M79674 Pain in right toe(s): Secondary | ICD-10-CM | POA: Diagnosis not present

## 2024-04-28 DIAGNOSIS — M79675 Pain in left toe(s): Secondary | ICD-10-CM | POA: Diagnosis not present

## 2024-04-28 NOTE — Progress Notes (Signed)
" °   °  °  Subjective:  Patient ID: Karen Briggs, female    DOB: [birth year],  MRN: [id number]  Karen Briggs presents to clinic today for:  Chief Complaint  Patient presents with   [facility]    Not diabetic, takes asa 4, denies corn/callous needs.    Patient notes nails are thick and elongated, causing pain in shoe gear when ambulating.    PCP is Dr. Donna Maffucci, last seen on 02/18/24.  Past Medical History:  Diagnosis Date   BMI 26.0-26.9,adult 09/16/2019   Chronic kidney disease, stage 3a (HCC) 05/31/2019   Essential hypertension 10/17/2017   Iron  deficiency anemia 05/31/2019   Kidney disease    Stage 3   Mixed hyperlipidemia 10/17/2017   Osteoporosis 12/07/2019   Other specified hypothyroidism 10/17/2017   Palpitations 10/17/2017   Plantar wart 09/15/2019   Vitamin D  insufficiency 05/31/2019    Allergies  Allergen Reactions   Levofloxacin Palpitations   Alendronate      diarrhea   Sulfa Antibiotics Nausea Only    Objective:  Karen Briggs is a pleasant 89 y.o. female in NAD. AAO x 3.  Vascular Examination: Patient has palpable DP pulse, absent PT pulse bilateral.  Delayed capillary refill bilateral toes.  Sparse digital hair bilateral.  Proximal to distal cooling WNL bilateral.    Dermatological Examination: Interspaces are clear with no open lesions noted bilateral.  Skin is shiny and atrophic bilateral.  Nails are 3-12mm thick, with yellowish/brown discoloration, subungual debris and distal onycholysis x10.  There is pain with compression of nails x10.    Patient qualifies for at-risk foot care because of PVD.  Assessment/Plan: 1. Pain due to onychomycosis of toenails of both feet   2. PVD (peripheral vascular disease)     Mycotic nails x10 were sharply debrided with sterile nail nippers and power debriding burr to decrease bulk and length.  Return in about 16 weeks (around 08/18/2024) for RFC.   Karen Briggs, DPM, FACFAS [facility]     [address]  [place], [place] [address]                Office [phone]  Fax [phone] "

## 2024-08-09 ENCOUNTER — Ambulatory Visit: Admitting: Podiatry
